# Patient Record
Sex: Male | Born: 1990
Health system: Southern US, Community
[De-identification: ages and names within clinical notes are randomized; demographics above are authoritative.]

## PROBLEM LIST (undated history)

## (undated) DIAGNOSIS — Z8669 Personal history of other diseases of the nervous system and sense organs: Secondary | ICD-10-CM

## (undated) DIAGNOSIS — G510 Bell's palsy: Secondary | ICD-10-CM

## (undated) HISTORY — DX: Personal history of other diseases of the nervous system and sense organs: Z86.69

---

## 2014-10-25 ENCOUNTER — Encounter (HOSPITAL_BASED_OUTPATIENT_CLINIC_OR_DEPARTMENT_OTHER): Payer: Self-pay | Admitting: *Deleted

## 2014-10-25 ENCOUNTER — Emergency Department (HOSPITAL_BASED_OUTPATIENT_CLINIC_OR_DEPARTMENT_OTHER)
Admission: EM | Admit: 2014-10-25 | Discharge: 2014-10-25 | Disposition: A | Discharge status: Home / Self Care | Payer: Self-pay | Attending: Emergency Medicine | Admitting: Emergency Medicine

## 2014-10-25 DIAGNOSIS — L259 Unspecified contact dermatitis, unspecified cause: Secondary | ICD-10-CM | POA: Insufficient documentation

## 2014-10-25 DIAGNOSIS — Z720 Tobacco use: Secondary | ICD-10-CM | POA: Insufficient documentation

## 2014-10-25 MED ORDER — HYDROCORTISONE 1 % EX CREA: TOPICAL_CREAM | CUTANEOUS | Status: DC

## 2014-10-25 MED ORDER — DIPHENHYDRAMINE HCL 25 MG PO TABS
25.0000 mg | ORAL_TABLET | Freq: Four times a day (QID) | ORAL | Status: DC
Start: 2014-10-25 — End: 2015-09-17

## 2014-10-25 NOTE — Discharge Instructions (Signed)

## 2014-10-25 NOTE — ED Provider Notes (Signed)
CSN: [id number]     Arrival date & time 10/25/14  1010 History   First MD Initiated Contact with Patient 10/25/14 1036     Chief Complaint  Patient presents with  . Rash     (Consider location/radiation/quality/duration/timing/severity/associated sxs/prior Treatment) HPI Patient works in Aeronautical [employer].  He reports a new rash with itching in his right antecubital fossa.  No other complaints.  No fevers or chills.  No history of asthma or eczema.  His pain is mild.  He has not tried any medications prior to arrival.   History reviewed. No pertinent past medical history. History reviewed. No pertinent past surgical history. No family history on file. Social History  Substance Use Topics  . Smoking status: Current Some Day Smoker  . Smokeless tobacco: None  . Alcohol Use: Yes     Comment: sociable    Review of Systems  All other systems reviewed and are negative.     Allergies  Review of patient's allergies indicates no known allergies.  Home Medications   Prior to Admission medications   Medication Sig Start Date End Date Taking? Authorizing Provider  diphenhydrAMINE (BENADRYL) 25 MG tablet Take 1 tablet (25 mg total) by mouth every 6 (six) hours. 10/25/14   Azalia Bilis, MD  hydrocortisone cream 1 % Apply to affected area 2 times daily 10/25/14   Azalia Bilis, MD   BP 116/69 mmHg  Pulse 72  Temp(Src) 98 F (36.7 C) (Oral)  Resp 18  Ht  (1.702 m)  Wt 138 lb (62.596 kg)  BMI 21.61 kg/m2  SpO2 98% Physical Exam  Constitutional: He is oriented to person, place, and time. He appears well-developed and well-nourished.  HENT:  Head: Normocephalic.  Eyes: EOM are normal.  Neck: Normal range of motion.  Pulmonary/Chest: Effort normal.  Abdominal: He exhibits no distension.  Musculoskeletal: Normal range of motion.  Neurological: He is alert and oriented to person, place, and time.  Skin:  Area of contact dermatitis without surrounding infection in his right  antecubital fossa.  Psychiatric: He has a normal mood and affect.  Nursing note and vitals reviewed.   ED Course  Procedures (including critical care time) Labs Review Labs Reviewed - No data to display  Imaging Review No results found. I have personally reviewed and evaluated these images and lab results as part of my medical decision-making.   EKG Interpretation None      MDM   Final diagnoses:  Contact dermatitis    Contact dermatitis without infection.  Small area.  Benadryl and hydrocortisone cream.    Azalia Bilis, MD 10/25/14 1056

## 2014-10-25 NOTE — ED Notes (Signed)
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

## 2014-11-09 ENCOUNTER — Emergency Department (HOSPITAL_BASED_OUTPATIENT_CLINIC_OR_DEPARTMENT_OTHER)
Admission: EM | Admit: 2014-11-09 | Discharge: 2014-11-09 | Disposition: A | Discharge status: Home / Self Care | Payer: Self-pay | Attending: Emergency Medicine | Admitting: Emergency Medicine

## 2014-11-09 ENCOUNTER — Encounter (HOSPITAL_BASED_OUTPATIENT_CLINIC_OR_DEPARTMENT_OTHER): Payer: Self-pay | Admitting: *Deleted

## 2014-11-09 DIAGNOSIS — G510 Bell's palsy: Secondary | ICD-10-CM

## 2014-11-09 DIAGNOSIS — Z720 Tobacco use: Secondary | ICD-10-CM | POA: Insufficient documentation

## 2014-11-09 MED ORDER — PREDNISONE 20 MG PO TABS: ORAL_TABLET | ORAL | Status: DC

## 2014-11-09 MED ORDER — VALACYCLOVIR HCL 1 G PO TABS: 1000.0000 mg | ORAL_TABLET | Freq: Three times a day (TID) | ORAL | Status: AC

## 2014-11-09 NOTE — Discharge Instructions (Signed)
Bell's Palsy °Bell's palsy is a condition in which the muscles on one side of the face cannot move (paralysis). This is because the nerves in the face are paralyzed. It is most often thought to be caused by a virus. The virus causes swelling of the nerve that controls movement on one side of the face. The nerve travels through a tight space surrounded by bone. When the nerve swells, it can be compressed by the bone. This results in damage to the protective covering around the nerve. This damage interferes with how the nerve communicates with the muscles of the face. As a result, it can cause weakness or paralysis of the facial muscles.  °Injury (trauma), tumor, and surgery may cause Bell's palsy, but most of the time the cause is unknown. It is a relatively common condition. It starts suddenly (abrupt onset) with the paralysis usually ending within 2 days. Bell's palsy is not dangerous. But because the eye does not close properly, you may need care to keep the eye from getting dry. This can include splinting (to keep the eye shut) or moistening with artificial tears. Bell's palsy very seldom occurs on both sides of the face at the same time. °SYMPTOMS  °· Eyebrow sagging. °· Drooping of the eyelid and corner of the mouth. °· Inability to close one eye. °· Loss of taste on the front of the tongue. °· Sensitivity to loud noises. °TREATMENT  °The treatment is usually non-surgical. If the patient is seen within the first 24 to 48 hours, a short course of steroids may be prescribed, in an attempt to shorten the length of the condition. Antiviral medicines may also be used with the steroids, but it is unclear if they are helpful.  °You will need to protect your eye, if you cannot close it. The cornea (clear covering over your eye) will become dry and can be damaged. Artificial tears can be used to keep your eye moist. Glasses or an eye patch should be worn to protect your eye. °PROGNOSIS  °Recovery is variable, ranging  from days to months. Although the problem usually goes away completely (about 80% of cases resolve), predicting the outcome is impossible. Most people improve within 3 weeks of when the symptoms began. Improvement may continue for 3 to 6 months. A small number of people have moderate to severe weakness that is permanent.  °HOME CARE INSTRUCTIONS  °· If your caregiver prescribed medication to reduce swelling in the nerve, use as directed. Do not stop taking the medication unless directed by your caregiver. °· Use moisturizing eye drops as needed to prevent drying of your eye, as directed by your caregiver. °· Protect your eye, as directed by your caregiver. °· Use facial massage and exercises, as directed by your caregiver. °· Perform your normal activities, and get your normal rest. °SEEK IMMEDIATE MEDICAL CARE IF:  °· There is pain, redness or irritation in the eye. °· You or your child has an oral temperature above 102° F (38.9° C), not controlled by medicine. °MAKE SURE YOU:  °· Understand these instructions. °· Will watch your condition. °· Will get help right away if you are not doing well or get worse. °Document Released: 01/30/2005 Document Revised: 04/24/2011 Document Reviewed: 05/09/2013 °[employer]® Patient Information ©2015 [employer]. This information is not intended to replace advice given to you by your health care provider. Make sure you discuss any questions you have with your health care provider. ° °

## 2014-11-09 NOTE — ED Notes (Signed)
Numbness to the right side of his face and unable to close his right eye x 2 days. No hx of Bells Palsy.

## 2014-11-09 NOTE — ED Provider Notes (Signed)
CSN: [id number]     Arrival date & time 11/09/14  1107 History   First MD Initiated Contact with Patient 11/09/14 1145     Chief Complaint  Patient presents with  . Numbness   Tommy Henderson is a 24 y.o. male who is otherwise healthy who presents to the ED complaining of a feeling of numbness in his right face, right lips, crooked smile and unable to completely close his right eye all starting two days ago. The patient reports he feels that his eye is more dried out and this morning he could not close his eye completely. The patient denies recent illness. He denies history of bell's palsy. He denies numbness, tingling or weakness in his extremities.  He denies fevers, sore throat, coughing, rashes, abdominal pain, nausea, vomiting, headache, neck pain, neck stiffness, or trouble swallowing.  (Consider location/radiation/quality/duration/timing/severity/associated sxs/prior Treatment) HPI  History reviewed. No pertinent past medical history. History reviewed. No pertinent past surgical history. No family history on file. Social History  Substance Use Topics  . Smoking status: Current Some Day Smoker  . Smokeless tobacco: None  . Alcohol Use: Yes     Comment: sociable    Review of Systems  Constitutional: Negative for fever, chills and fatigue.  HENT: Negative for congestion, drooling, ear pain, facial swelling, hearing loss, mouth sores, sore throat and trouble swallowing.   Eyes: Negative for pain and visual disturbance.  Respiratory: Negative for cough, shortness of breath and wheezing.   Cardiovascular: Negative for chest pain.  Gastrointestinal: Negative for nausea, vomiting and abdominal pain.  Genitourinary: Negative for dysuria.  Musculoskeletal: Negative for back pain, neck pain and neck stiffness.  Skin: Negative for rash.  Neurological: Positive for facial asymmetry and numbness. Negative for dizziness, speech difficulty, weakness, light-headedness and headaches.       Allergies  Review of patient's allergies indicates no known allergies.  Home Medications   Prior to Admission medications   Medication Sig Start Date End Date Taking? Authorizing Provider  diphenhydrAMINE (BENADRYL) 25 MG tablet Take 1 tablet (25 mg total) by mouth every 6 (six) hours. 10/25/14   Azalia Bilis, MD  hydrocortisone cream 1 % Apply to affected area 2 times daily 10/25/14   Azalia Bilis, MD  predniSONE (DELTASONE) 20 MG tablet Take 3 tablets daily (  total) for 5 days, then 2 tablets daily (40 mg total) for 2 days, then 1 tablet daily (20 mg total) for 2 days, then 1/2 tablet daily (10 mg total) for 2 days. 11/09/14   Everlene Farrier, PA-C  valACYclovir (VALTREX) 1000 MG tablet Take 1 tablet (1,000 mg total) by mouth 3 (three) times daily. 11/09/14 11/23/14  Everlene Farrier, PA-C   BP 122/70 mmHg  Pulse 73  Temp(Src) 98.2 F (36.8 C) (Oral)  Resp 18  Ht  (1.702 m)  Wt 138 lb (62.596 kg)  BMI 21.61 kg/m2  SpO2 100% Physical Exam  Constitutional: He is oriented to person, place, and time. He appears well-developed and well-nourished. No distress.   Nontoxic appearing.  HENT:  Head: Normocephalic and atraumatic.  Right Ear: External ear normal.  Left Ear: External ear normal.  Mouth/Throat: Oropharynx is clear and moist. No oropharyngeal exudate.  Eyes: Conjunctivae and EOM are normal. Pupils are equal, round, and reactive to light. Right eye exhibits no discharge. Left eye exhibits no discharge.  Neck: Normal range of motion. Neck supple. No JVD present. No tracheal deviation present.  Cardiovascular: Normal rate, regular rhythm, normal heart sounds and intact distal  pulses.   Pulmonary/Chest: Effort normal and breath sounds normal. No respiratory distress. He has no wheezes. He has no rales.  Abdominal: Soft. There is no tenderness. There is no guarding.  Musculoskeletal: He exhibits no edema or tenderness.  Lymphadenopathy:    He has no cervical adenopathy.   Neurological: He is alert and oriented to person, place, and time. Coordination normal.   The patient is alert and oriented 3. Evidence of cranial nerve VII palsy. Decreased right-sided nasolabial fold. Right-sided crooked smile. Patient  Is able to close both of his eyes but is unable to squeeze his right eye shut. He reports decreased sensation on the right side of his face. Creases in the right side of his forehead are diminished. EOMs are intact. Right eye does not appear dry.  Good and equal grip strengths bilaterally. No pronator drift. Finger nose intact bilaterally. Sensation intact in his bilateral upper and lower extremities. The patient is able to ambulate with normal gait.  Skin: Skin is warm and dry. No rash noted. He is not diaphoretic. No erythema. No pallor.  Psychiatric: He has a normal mood and affect. His behavior is normal.  Nursing note and vitals reviewed.   ED Course  Procedures (including critical care time) Labs Review Labs Reviewed - No data to display  Imaging Review No results found.    EKG Interpretation None      Filed Vitals:   11/09/14 1128  BP: 122/70  Pulse: 73  Temp: 98.2 F (36.8 C)  TempSrc: Oral  Resp: 18  Height:  (1.702 m)  Weight: 138 lb (62.596 kg)  SpO2: 100%     MDM   Meds given in ED:  Medications - No data to display  New Prescriptions   PREDNISONE (DELTASONE) 20 MG TABLET    Take 3 tablets daily (  total) for 5 days, then 2 tablets daily (40 mg total) for 2 days, then 1 tablet daily (20 mg total) for 2 days, then 1/2 tablet daily (10 mg total) for 2 days.   VALACYCLOVIR (VALTREX) 1000 MG TABLET    Take 1 tablet (1,000 mg total) by mouth 3 (three) times daily.    Final diagnoses:  Bell's palsy   This is a 24 y.o. male who is otherwise healthy who presents to the ED complaining of a feeling of numbness in his right face, right lips, crooked smile and unable to completely close his right eye all starting two days  ago. Exam is consistent with Bells' Palsy. No other neurological deficits. He is afebrile and non-toxic appearing.   I provided education on Bell's palsy and had treated. Will discharge with prescriptions for prednisone 10 day taper as well as Valtrex. I also advised the patient to obtain artificial tears for eye drops in his right eye to prevent his right eye from drying out. The patient is able to close his eye at this time.  I encouraged follow-up with primary care and strict return precautions. I advised the patient to follow-up with their primary care provider this week. I advised the patient to return to the emergency department with new or worsening symptoms or new concerns. The patient verbalized understanding and agreement with plan.     Everlene Farrier, PA-C 11/09/14 1227  Lyndal Pulley, MD 11/09/14 2052

## 2014-11-19 ENCOUNTER — Encounter: Payer: Self-pay | Admitting: Family Medicine

## 2014-11-19 ENCOUNTER — Ambulatory Visit (INDEPENDENT_AMBULATORY_CARE_PROVIDER_SITE_OTHER): Payer: Self-pay | Admitting: Family Medicine

## 2014-11-19 VITALS — BP 125/45 | HR 81 | Temp 98.8°F | Resp 18 | Ht 67.0 in | Wt 137.0 lb

## 2014-11-19 DIAGNOSIS — G510 Bell's palsy: Secondary | ICD-10-CM | POA: Insufficient documentation

## 2014-11-19 DIAGNOSIS — Z202 Contact with and (suspected) exposure to infections with a predominantly sexual mode of transmission: Secondary | ICD-10-CM

## 2014-11-19 DIAGNOSIS — Z0000 Encounter for general adult medical examination without abnormal findings: Secondary | ICD-10-CM

## 2014-11-19 LAB — CBC WITH DIFFERENTIAL/PLATELET
BASOS ABS: 0 10*3/uL (ref 0.0–0.1)
Basophils Relative: 0 % (ref 0–1)
EOS ABS: 0 10*3/uL (ref 0.0–0.7)
EOS PCT: 0 % (ref 0–5)
HCT: 43.1 % (ref 39.0–52.0)
Hemoglobin: 14.2 g/dL (ref 13.0–17.0)
Lymphocytes Relative: 8 % — ABNORMAL LOW (ref 12–46)
Lymphs Abs: 0.9 10*3/uL (ref 0.7–4.0)
MCH: 29.5 pg (ref 26.0–34.0)
MCHC: 32.9 g/dL (ref 30.0–36.0)
MCV: 89.6 fL (ref 78.0–100.0)
MONO ABS: 0.3 10*3/uL (ref 0.1–1.0)
MPV: 10.7 fL (ref 8.6–12.4)
Monocytes Relative: 3 % (ref 3–12)
Neutro Abs: 9.6 10*3/uL — ABNORMAL HIGH (ref 1.7–7.7)
Neutrophils Relative %: 89 % — ABNORMAL HIGH (ref 43–77)
PLATELETS: 271 10*3/uL (ref 150–400)
RBC: 4.81 MIL/uL (ref 4.22–5.81)
RDW: 12.2 % (ref 11.5–15.5)
WBC: 10.8 10*3/uL — AB (ref 4.0–10.5)

## 2014-11-19 LAB — COMPLETE METABOLIC PANEL WITH GFR
ALBUMIN: 4.2 g/dL (ref 3.6–5.1)
ALK PHOS: 103 U/L (ref 40–115)
ALT: 24 U/L (ref 9–46)
AST: 13 U/L (ref 10–40)
BILIRUBIN TOTAL: 0.5 mg/dL (ref 0.2–1.2)
BUN: 15 mg/dL (ref 7–25)
CALCIUM: 8.7 mg/dL (ref 8.6–10.3)
CO2: 31 mmol/L (ref 20–31)
CREATININE: 0.82 mg/dL (ref 0.60–1.35)
Chloride: 103 mmol/L (ref 98–110)
Glucose, Bld: 80 mg/dL (ref 65–99)
Potassium: 4.1 mmol/L (ref 3.5–5.3)
Sodium: 140 mmol/L (ref 135–146)
TOTAL PROTEIN: 6.8 g/dL (ref 6.1–8.1)

## 2014-11-19 LAB — POCT URINALYSIS DIP (DEVICE)
Bilirubin Urine: NEGATIVE
GLUCOSE, UA: NEGATIVE mg/dL
Hgb urine dipstick: NEGATIVE
KETONES UR: NEGATIVE mg/dL
LEUKOCYTES UA: NEGATIVE
Nitrite: NEGATIVE
PROTEIN: NEGATIVE mg/dL
Specific Gravity, Urine: 1.015 (ref 1.005–1.030)
Urobilinogen, UA: 0.2 mg/dL (ref 0.0–1.0)
pH: 7.5 (ref 5.0–8.0)

## 2014-11-19 NOTE — Progress Notes (Signed)
Subjective:    Patient ID: Tommy Henderson, male    DOB: 1990/07/06, 24 y.o.   MRN: [id number]  HPI  Mr. Syrus Nakama, a 24 year old male that presents accompanied by fiance to establish care. He was recently evaluated on the emergency department on 11/09/2014 for numbness to the right face and a crooked smile. Patient was found to have Bell's palsy. He was started on a regimen of glucocorticoids and acyclovir. Patient has completed the course. He states that his right eye is dry in the mornings. Patient states that he has been using visine to alleviate dryness to right eye. He denies headache, fatigue, facial numbness, nausea, vomiting, or diarrhea. Patient states that he has not had a primary physician. He has primarily been utilizing the emergency department or urgent care for most healthcare needs. He reports that he is generally healthy. He maintains that he exercises several times per week, he does not follow a health diet, and does not use barrier protection with sexual intercourse. He also states that he does not perform monthly testicular exams.  History reviewed. No pertinent past medical history.   There is no immunization history on file for this patient.  No Known Allergies Social History   Social History  . Marital Status: Single    Spouse Name: N/A  . Number of Children: N/A  . Years of Education: N/A   Occupational History  . Not on file.   Social History Main Topics  . Smoking status: Former Smoker    Quit date: 09/19/2014  . Smokeless tobacco: Not on file  . Alcohol Use: 0.0 oz/week    0 Standard drinks or equivalent per week     Comment: sociable  . Drug Use: Yes    Special: Marijuana  . Sexual Activity: Not on file   Other Topics Concern  . Not on file   Social History Narrative   Review of Systems  Constitutional: Negative.  Negative for fatigue.  HENT: Negative.   Eyes: Negative.   Respiratory: Negative.  Negative for cough and shortness of breath.    Cardiovascular: Negative.  Negative for chest pain, palpitations and leg swelling.  Gastrointestinal: Negative.  Negative for nausea, vomiting, diarrhea and constipation.  Endocrine: Negative.  Negative for cold intolerance, heat intolerance, polydipsia, polyphagia and polyuria.  Genitourinary: Negative.  Negative for discharge, penile pain and testicular pain.  Musculoskeletal: Negative.   Skin: Negative.   Allergic/Immunologic: Negative.  Negative for immunocompromised state.  Neurological: Positive for facial asymmetry.  Hematological: Negative.   Psychiatric/Behavioral: Negative.  Negative for suicidal ideas and sleep disturbance.       Objective:   Physical Exam  Constitutional: He appears well-developed and well-nourished.  HENT:  Head: Normocephalic and atraumatic.  Right Ear: External ear normal.  Left Ear: External ear normal.  Mouth/Throat: Oropharynx is clear and moist.  Eyes: Conjunctivae and EOM are normal. Pupils are equal, round, and reactive to light.  Neck: Normal range of motion. Neck supple.  Cardiovascular: Normal rate, regular rhythm, normal heart sounds and intact distal pulses.   Pulmonary/Chest: Effort normal and breath sounds normal.  Abdominal: Soft. Bowel sounds are normal.  Neurological: He is alert. He has normal strength. A cranial nerve deficit is present. No sensory deficit. He displays a negative Romberg sign.  Skin: Skin is warm and dry.  Psychiatric: He has a normal mood and affect. His behavior is normal. Judgment and thought content normal.      BP 125/45 mmHg  Pulse  81  Temp(Src) 98.8 F (37.1 C) (Oral)  Resp 18  Ht  (1.702 m)  Wt 137 lb (62.143 kg)  BMI 21.45 kg/m2  SpO2 100% Assessment & Plan:  1. Bell's palsy Complete acyclovir and prednisone therapy. Utilized OTC eye lubricant as directed for occasional eye dryness. Discussed fact that most recover from Bell's Palsy. Will follow up in 1 month to evaluate for corneal abrasion.   2. Routine health maintenance Recommend a low fat, low salt diet Continue exercising 3-4 days per week Recommend monthly self-testicular disease Recommend using barrier protection with sexual intercourse Patient refuses immunizations - POCT urinalysis dipstick - COMPLETE METABOLIC PANEL WITH GFR - TSH - CBC with Differential  3. Possible exposure to STD Patient does not use barrier protection with sexual intercourse.  - RPR - HIV antibody (with reflex) - GC/Chlamydia Probe Amp   RTC: 1 month for Bell's Palsy The patient was given clear instructions to go to ER or return to medical center if symptoms do not improve, worsen or new problems develop. The patient verbalized understanding. Will notify patient with laboratory results.  Massie Maroon, FNP

## 2014-11-19 NOTE — Patient Instructions (Signed)
Bell Palsy °Bell palsy is a condition in which the muscles on one side of the face become paralyzed. This often causes one side of the face to droop. It is a common condition and most people recover completely. °RISK FACTORS °Risk factors for Bell palsy include: °· Pregnancy. °· Diabetes. °· An infection by a virus, such as infections that cause cold sores. °CAUSES  °Bell palsy is caused by damage to or inflammation of a nerve in your face. It is unclear why this happens, but an infection by a virus may lead to it. Most of the time the reason it happens is unknown. °SIGNS AND SYMPTOMS  °Symptoms can range from mild to severe and can take place over a number of hours. Symptoms may include: °· Being unable to: °¨ Raise one or both eyebrows. °¨ Close one or both eyes. °¨ Feel parts of your face (facial numbness). °· Drooping of the eyelid and corner of the mouth. °· Weakness in the face. °· Paralysis of half your face. °· Loss of taste. °· Sensitivity to loud noises. °· Difficulty chewing. °· Tearing up of the affected eye. °· Dryness in the affected eye. °· Drooling. °· Pain behind one ear. °DIAGNOSIS  °Diagnosis of Bell palsy may include: °· A medical history and physical exam. °· An MRI. °· A CT scan. °· Electromyography (EMG). This is a test that checks how your nerves are working. °TREATMENT  °Treatment may include antiviral medicine to help shorten the length of the condition. Sometimes treatment is not needed and the symptoms go away on their own. °HOME CARE INSTRUCTIONS  °· Take medicines only as directed by your health care provider. °· Do facial massages and exercises as directed by your health care provider. °· If your eye is affected: °¨ Use moisturizing eye drops to prevent drying of your eye as directed by your health care provider. °¨ Protect your eye as directed by your health care provider. °SEEK MEDICAL CARE IF: °· Your symptoms do not get better or get worse. °· You are drooling. °· Your eye is red,  irritated, or hurts. °SEEK IMMEDIATE MEDICAL CARE IF:  °· Another part of your body feels weak or numb. °· You have difficulty swallowing. °· You have a fever along with symptoms of Bell palsy. °· You develop neck pain. °MAKE SURE YOU:  °· Understand these instructions. °· Will watch your condition. °· Will get help right away if you are not doing well or get worse. °  °This information is not intended to replace advice given to you by your health care provider. Make sure you discuss any questions you have with your health care provider. °  °Document Released: 01/30/2005 Document Revised: 10/21/2014 Document Reviewed: 05/09/2013 °[employer]vier Interactive Patient Education ©2016 [employer] ° °

## 2014-11-20 LAB — GC/CHLAMYDIA PROBE AMP
CT PROBE, AMP APTIMA: NEGATIVE
GC Probe RNA: NEGATIVE

## 2014-11-20 LAB — TSH: TSH: 0.248 u[IU]/mL — ABNORMAL LOW (ref 0.350–4.500)

## 2014-11-20 LAB — HIV ANTIBODY (ROUTINE TESTING W REFLEX): HIV: NONREACTIVE

## 2014-11-20 LAB — RPR

## 2014-12-23 ENCOUNTER — Ambulatory Visit: Payer: Self-pay | Admitting: Family Medicine

## 2015-07-15 ENCOUNTER — Encounter (HOSPITAL_BASED_OUTPATIENT_CLINIC_OR_DEPARTMENT_OTHER): Payer: Self-pay | Admitting: Emergency Medicine

## 2015-07-15 ENCOUNTER — Emergency Department (HOSPITAL_BASED_OUTPATIENT_CLINIC_OR_DEPARTMENT_OTHER)
Admission: EM | Admit: 2015-07-15 | Discharge: 2015-07-15 | Disposition: A | Discharge status: Home / Self Care | Payer: Self-pay | Attending: Emergency Medicine | Admitting: Emergency Medicine

## 2015-07-15 DIAGNOSIS — K4090 Unilateral inguinal hernia, without obstruction or gangrene, not specified as recurrent: Secondary | ICD-10-CM | POA: Insufficient documentation

## 2015-07-15 DIAGNOSIS — Z87891 Personal history of nicotine dependence: Secondary | ICD-10-CM | POA: Insufficient documentation

## 2015-07-15 HISTORY — DX: Bell's palsy: G51.0

## 2015-07-15 LAB — URINALYSIS, ROUTINE W REFLEX MICROSCOPIC
Bilirubin Urine: NEGATIVE
Glucose, UA: NEGATIVE mg/dL
Ketones, ur: NEGATIVE mg/dL
Leukocytes, UA: NEGATIVE
Nitrite: NEGATIVE
Protein, ur: NEGATIVE mg/dL
Specific Gravity, Urine: 1.021 (ref 1.005–1.030)
pH: 5.5 (ref 5.0–8.0)

## 2015-07-15 LAB — URINE MICROSCOPIC-ADD ON

## 2015-07-15 NOTE — Discharge Instructions (Signed)

## 2015-07-15 NOTE — ED Notes (Signed)
LLQ abd pain x3 days, intermittent. Increases with movement. Pt denies N/V/D. Pt denies urinary symptoms.

## 2015-07-15 NOTE — ED Notes (Signed)
MD at bedside. 

## 2015-07-27 NOTE — ED Provider Notes (Signed)
CSN: [id number]     Arrival date & time 07/15/15  0820 History   First MD Initiated Contact with Patient 07/15/15 [phone]     Chief Complaint  Patient presents with  . Abdominal Pain     (Consider location/radiation/quality/duration/timing/severity/associated sxs/prior Treatment) HPI   25 year old male with intermittent left lower quadrant/left groin pain. Onset 3 days ago. Pain is worse with movement and bending over. Minimal pain at rest. No urinary complaints. No rash. No nausea or vomiting. No change in his bowel movements. History of similar type symptoms previous to 3 days ago. Has not tried taking anything specifically for the symptoms.  Past Medical History  Diagnosis Date  . Bell's palsy    History reviewed. No pertinent past surgical history. Family History  Problem Relation Age of Onset  . Family history unknown: Yes   Social History  Substance Use Topics  . Smoking status: Former Smoker    Quit date: 09/19/2014  . Smokeless tobacco: None  . Alcohol Use: 0.0 oz/week    0 Standard drinks or equivalent per week     Comment: sociable    Review of Systems  All systems reviewed and negative, other than as noted in HPI.   Allergies  Review of patient's allergies indicates no known allergies.  Home Medications   Prior to Admission medications   Medication Sig Start Date End Date Taking? Authorizing Provider  diphenhydrAMINE (BENADRYL) 25 MG tablet Take 1 tablet (25 mg total) by mouth every 6 (six) hours. Patient not taking: Reported on 11/19/2014 10/25/14   Azalia BilisKevin Campos, MD  hydrocortisone cream 1 % Apply to affected area 2 times daily Patient not taking: Reported on 11/19/2014 10/25/14   Azalia BilisKevin Campos, MD  predniSONE (DELTASONE) 20 MG tablet Take 3 tablets daily (60mg  total) for 5 days, then 2 tablets daily (40 mg total) for 2 days, then 1 tablet daily (20 mg total) for 2 days, then 1/2 tablet daily (10 mg total) for 2 days. 11/09/14   Everlene FarrierWilliam Dansie, PA-C   BP 121/80  mmHg  Pulse 67  Temp(Src) 99 F (37.2 C) (Oral)  Resp 16  Ht 5\' 7"  (1.702 m)  Wt 138 lb (62.596 kg)  BMI 21.61 kg/m2  SpO2 100% Physical Exam  Constitutional: He appears well-developed and well-nourished. No distress.  HENT:  Head: Normocephalic and atraumatic.  Eyes: Conjunctivae are normal. Right eye exhibits no discharge. Left eye exhibits no discharge.  Neck: Neck supple.  Cardiovascular: Normal rate, regular rhythm and normal heart sounds.  Exam reveals no gallop and no friction rub.   No murmur heard. Pulmonary/Chest: Effort normal and breath sounds normal. No respiratory distress.  Abdominal: Soft. He exhibits no distension. There is no tenderness.  Genitourinary:  Left inguinal hernia. Small bulge felt with Valsalva. No inguinal adenopathy. External male genitalia. No concerning lesions noted.  Musculoskeletal: He exhibits no edema or tenderness.  Neurological: He is alert.  Skin: Skin is warm and dry.  Psychiatric: He has a normal mood and affect. His behavior is normal. Thought content normal.  Nursing note and vitals reviewed.   ED Course  Procedures (including critical care time) Labs Review Labs Reviewed  URINALYSIS, ROUTINE W REFLEX MICROSCOPIC (NOT AT [facility]) - Abnormal; Notable for the following:    Hgb urine dipstick LARGE (*)    All other components within normal limits  URINE MICROSCOPIC-ADD ON - Abnormal; Notable for the following:    Squamous Epithelial / LPF 0-5 (*)    Bacteria, UA RARE (*)  All other components within normal limits    Imaging Review No results found. I have personally reviewed and evaluated these images and lab results as part of my medical decision-making.   EKG Interpretation None      MDM   Final diagnoses:  Left inguinal hernia    25 year old male with intermittent left inguinal pain. He does have a hernia on exam. No signs of incarceration. Return precautions were discussed. Symptomatic treatment. Surgical follow-up  for persistent symptoms.    Raeford Razor, MD 07/27/15 [phone]

## 2015-09-17 ENCOUNTER — Encounter (HOSPITAL_BASED_OUTPATIENT_CLINIC_OR_DEPARTMENT_OTHER): Payer: Self-pay | Admitting: *Deleted

## 2015-09-17 ENCOUNTER — Emergency Department (HOSPITAL_BASED_OUTPATIENT_CLINIC_OR_DEPARTMENT_OTHER)
Admission: EM | Admit: 2015-09-17 | Discharge: 2015-09-17 | Disposition: A | Discharge status: Home / Self Care | Payer: Self-pay | Attending: Emergency Medicine | Admitting: Emergency Medicine

## 2015-09-17 DIAGNOSIS — R6883 Chills (without fever): Secondary | ICD-10-CM | POA: Insufficient documentation

## 2015-09-17 DIAGNOSIS — F17210 Nicotine dependence, cigarettes, uncomplicated: Secondary | ICD-10-CM | POA: Insufficient documentation

## 2015-09-17 DIAGNOSIS — R112 Nausea with vomiting, unspecified: Secondary | ICD-10-CM | POA: Insufficient documentation

## 2015-09-17 LAB — BASIC METABOLIC PANEL
ANION GAP: 6 (ref 5–15)
BUN: 12 mg/dL (ref 6–20)
CHLORIDE: 103 mmol/L (ref 101–111)
CO2: 26 mmol/L (ref 22–32)
Calcium: 8.5 mg/dL — ABNORMAL LOW (ref 8.9–10.3)
Creatinine, Ser: 0.81 mg/dL (ref 0.61–1.24)
Glucose, Bld: 96 mg/dL (ref 65–99)
POTASSIUM: 3.4 mmol/L — AB (ref 3.5–5.1)
SODIUM: 135 mmol/L (ref 135–145)

## 2015-09-17 LAB — CBC WITH DIFFERENTIAL/PLATELET
BASOS ABS: 0 10*3/uL (ref 0.0–0.1)
BASOS PCT: 1 %
EOS ABS: 0 10*3/uL (ref 0.0–0.7)
EOS PCT: 0 %
HCT: 39.2 % (ref 39.0–52.0)
HEMOGLOBIN: 13.5 g/dL (ref 13.0–17.0)
LYMPHS ABS: 0.9 10*3/uL (ref 0.7–4.0)
Lymphocytes Relative: 21 %
MCH: 29.5 pg (ref 26.0–34.0)
MCHC: 34.4 g/dL (ref 30.0–36.0)
MCV: 85.8 fL (ref 78.0–100.0)
Monocytes Absolute: 0.8 10*3/uL (ref 0.1–1.0)
Monocytes Relative: 17 %
NEUTROS PCT: 61 %
Neutro Abs: 2.7 10*3/uL (ref 1.7–7.7)
PLATELETS: 168 10*3/uL (ref 150–400)
RBC: 4.57 MIL/uL (ref 4.22–5.81)
RDW: 11 % — ABNORMAL LOW (ref 11.5–15.5)
WBC: 4.5 10*3/uL (ref 4.0–10.5)

## 2015-09-17 NOTE — ED Provider Notes (Signed)
[facility] Note   CSN: [id number] Arrival date & time: 09/17/15  2006  First Provider Contact:   First MD Initiated Contact with Patient 09/17/15 2119     By signing my name below, I, Freida Busman, attest that this documentation has been prepared under the direction and in the presence of Lavera Guise, MD . Electronically Signed: Freida Busman, Scribe. 09/17/2015. 10:48 PM.  History   Chief Complaint Chief Complaint  Patient presents with  . Emesis    The history is provided by the patient. No language interpreter was used.    HPI Comments:  Tommy Henderson is a 25 y.o. male who presents to the Emergency Department complaining of 1 episode of vomiting this morning.  Pt notes he didn't feel well after eating old spaghetti last night w/ beer. HAd large bowel movement this morning but no diarrhea. He denies blood in his stool and blood in his vomit. No fever or abdominal pain. Pt reports associated chills and decreased PO intake today. Pt also notes short episode of mild lightheadedness today which has resolved. He denies fever, urinary symptoms, vision loss, speech changes, and abdominal pain. He also denies h/o abdominal surgeries. No recent sick contacts. No alleviating factors noted.  Past Medical History:  Diagnosis Date  . Bell's palsy     Patient Active Problem List   Diagnosis Date Noted  . Bell's palsy 11/19/2014    History reviewed. No pertinent surgical history.    Home Medications    Prior to Admission medications   Not on File    Family History Family History  Problem Relation Age of Onset  . Family history unknown: Yes    Social History Social History  Substance Use Topics  . Smoking status: Current Every Day Smoker    Types: Cigars    Last attempt to quit: 09/19/2014  . Smokeless tobacco: Not on file  . Alcohol use 0.0 oz/week     Comment: sociable     Allergies   Review of patient's allergies indicates no known  allergies.   Review of Systems Review of Systems  Constitutional: Positive for appetite change and chills.  Eyes: Negative for visual disturbance.  Gastrointestinal: Positive for vomiting. Negative for blood in stool, constipation and diarrhea.  Genitourinary: Negative for dysuria, frequency and urgency.  Neurological: Positive for light-headedness (resolved). Negative for speech difficulty.  All other systems reviewed and are negative.  Physical Exam Updated Vital Signs BP 118/85   Pulse 88   Temp 99.6 F (37.6 C)   Resp 18   Ht  (1.676 m)   Wt 130 lb (59 kg)   SpO2 100%   BMI 20.98 kg/m   Physical Exam Physical Exam  Nursing note and vitals reviewed. Constitutional: Well developed, well nourished, non-toxic, and in no acute distress Head: Normocephalic and atraumatic.  Mouth/Throat: Oropharynx is clear and moist.  Neck: Normal range of motion. Neck supple.  Cardiovascular: Normal rate and regular rhythm.   Pulmonary/Chest: Effort normal and breath sounds normal.  Abdominal: Soft. There is no tenderness. There is no rebound and no guarding.  Musculoskeletal: Normal range of motion.  Neurological: Alert, no facial droop, fluent speech, moves all extremities symmetrically Skin: Skin is warm and dry.  Psychiatric: Cooperative   ED Treatments / Results  DIAGNOSTIC STUDIES:  Oxygen Saturation is 100% on RA, normal by my interpretation.    COORDINATION OF CARE:  9:28 PM Discussed treatment plan with pt at bedside and pt agreed  to plan.  Labs (all labs ordered are listed, but only abnormal results are displayed) Labs Reviewed  CBC WITH DIFFERENTIAL/PLATELET - Abnormal; Notable for the following:       Result Value   RDW 11.0 (*)    All other components within normal limits  BASIC METABOLIC PANEL - Abnormal; Notable for the following:    Potassium 3.4 (*)    Calcium 8.5 (*)    All other components within normal limits    EKG  EKG Interpretation None        Radiology No results found.  Procedures Procedures   Medications Ordered in ED Medications - No data to display   Initial Impression / Assessment and Plan / ED Course  I have reviewed the triage vital signs and the nursing notes.  Pertinent labs & imaging results that were available during my care of the patient were reviewed by me and considered in my medical decision making (see chart for details).  Clinical Course   25 year old male, otherwise healthy, who presents with one episode of nausea and vomiting with decreased appetite and chills throughout the day today. This is in the setting of eating old spaghetti yesterday and I suspect that this is related. He is well-appearing, well-hydrated, and with normal vital signs. Has a soft and benign abdomen. Remainder of exam is unremarkable. Basic blood work overall unremarkable aside from mild hypokalemia of 3.4. No longer feels nauseous and is able to eat and drink in the ED without any difficulty. Feels improved.  suspect that this may be benign GI illness, that is now resolving. He will continue supportive care at home. Strict return and follow-up instructions are reviewed. He expressed understanding of all discharge instructions, and felt comfortable to plan of care. Final Clinical Impressions(s) / ED Diagnoses   Final diagnoses:  Non-intractable vomiting with nausea, vomiting of unspecified type    New Prescriptions New Prescriptions   No medications on file   I personally performed the services described in this documentation, which was scribed in my presence. The recorded information has been reviewed and is accurate.     Lavera Guise, MD 09/17/15 2250

## 2015-09-17 NOTE — ED Triage Notes (Signed)
Pt c/o n/v/d x 1 episode this am

## 2015-09-17 NOTE — Discharge Instructions (Signed)
Return without fail for worsening symptoms, including fever, severe abdominal pain, intractable headaches, intractable vomiting, or any other symptoms concerning to you.  We suspect that you had upset from the food you ate last night. Your blood work overall looks good.

## 2015-09-17 NOTE — ED Notes (Signed)
Pt states no n/v w crackers and ginger ale

## 2015-09-17 NOTE — ED Notes (Signed)
Pt states vomited x 1 this am,  Had has some facial soreness at times when look to far side of visual field

## 2015-12-01 ENCOUNTER — Encounter (HOSPITAL_BASED_OUTPATIENT_CLINIC_OR_DEPARTMENT_OTHER): Payer: Self-pay | Admitting: Emergency Medicine

## 2015-12-01 ENCOUNTER — Emergency Department (HOSPITAL_BASED_OUTPATIENT_CLINIC_OR_DEPARTMENT_OTHER)
Admission: EM | Admit: 2015-12-01 | Discharge: 2015-12-01 | Disposition: A | Discharge status: Home / Self Care | Payer: Self-pay | Attending: Emergency Medicine | Admitting: Emergency Medicine

## 2015-12-01 DIAGNOSIS — W57XXXA Bitten or stung by nonvenomous insect and other nonvenomous arthropods, initial encounter: Secondary | ICD-10-CM | POA: Insufficient documentation

## 2015-12-01 DIAGNOSIS — S00262A Insect bite (nonvenomous) of left eyelid and periocular area, initial encounter: Secondary | ICD-10-CM | POA: Insufficient documentation

## 2015-12-01 DIAGNOSIS — Y929 Unspecified place or not applicable: Secondary | ICD-10-CM | POA: Insufficient documentation

## 2015-12-01 DIAGNOSIS — F17290 Nicotine dependence, other tobacco product, uncomplicated: Secondary | ICD-10-CM | POA: Insufficient documentation

## 2015-12-01 DIAGNOSIS — T63444A Toxic effect of venom of bees, undetermined, initial encounter: Secondary | ICD-10-CM

## 2015-12-01 DIAGNOSIS — Y939 Activity, unspecified: Secondary | ICD-10-CM | POA: Insufficient documentation

## 2015-12-01 DIAGNOSIS — Y999 Unspecified external cause status: Secondary | ICD-10-CM | POA: Insufficient documentation

## 2015-12-01 NOTE — ED Provider Notes (Signed)
[facility]   CSN: [id number] Arrival date & time: 12/01/15  0734     History   Chief Complaint Chief Complaint  Patient presents with  . Eye Problem    HPI Tommy Henderson is a 25 y.o. male.  HPI Patient was stung by a bee on the left eyelid yesterday.  The eye initially swelled and as the day wore on he went down.  When he woke up this morning it was swelled again.  It is swelled where he has difficulty seeing unless he manually pulls his eye open.  He denies pain or fever. Past Medical History:  Diagnosis Date  . Bell's palsy     Patient Active Problem List   Diagnosis Date Noted  . Bell's palsy 11/19/2014    History reviewed. No pertinent surgical history.     Home Medications    Prior to Admission medications   Not on File    Family History Family History  Problem Relation Age of Onset  . Family history unknown: Yes    Social History Social History  Substance Use Topics  . Smoking status: Current Every Day Smoker    Types: Cigars    Last attempt to quit: 09/19/2014  . Smokeless tobacco: Never Used  . Alcohol use 0.0 oz/week     Comment: sociable     Allergies   Review of patient's allergies indicates no known allergies.   Review of Systems Review of Systems  All other systems reviewed and are negative.    Physical Exam Updated Vital Signs BP 135/74 (BP Location: Right Arm)   Pulse 65   Temp 98.3 F (36.8 C) (Oral)   Resp 16   Ht 5\' 7"  (1.702 m)   Wt 139 lb (63 kg)   SpO2 100%   BMI 21.77 kg/m   Physical Exam  Constitutional: He is oriented to person, place, and time. He appears well-developed and well-nourished. No distress.  HENT:  Head: Normocephalic.  Eyes: Pupils are equal, round, and reactive to light. Right eye exhibits normal extraocular motion. Left eye exhibits normal extraocular motion. Right pupil is round and reactive. Left pupil is round and reactive.  Left eyelid has some local edema and  swelling.  No evidence of cellulitis.  Neck: Normal range of motion.  Cardiovascular: Normal rate and intact distal pulses.   Pulmonary/Chest: No respiratory distress.  Abdominal: Normal appearance. He exhibits no distension.  Musculoskeletal: Normal range of motion.  Neurological: He is alert and oriented to person, place, and time. No cranial nerve deficit.  Skin: Skin is warm and dry. No rash noted.  Psychiatric: He has a normal mood and affect. His behavior is normal.  Nursing note and vitals reviewed.    ED Treatments / Results  Labs (all labs ordered are listed, but only abnormal results are displayed) Labs Reviewed - No data to display  EKG  EKG Interpretation None       Radiology No results found.  Procedures Procedures (including critical care time)  Medications Ordered in ED Medications - No data to display   Initial Impression / Assessment and Plan / ED Course  I have reviewed the triage vital signs and the nursing notes.  Pertinent labs & imaging results that were available during my care of the patient were reviewed by me and considered in my medical decision making (see chart for details).  Clinical Course      Final Clinical Impressions(s) / ED Diagnoses   Final diagnoses:  Bee sting, undetermined intent, initial encounter    New Prescriptions New Prescriptions   No medications on file     Nelva Nayobert Leonda Cristo, MD 12/01/15 [phone]

## 2015-12-01 NOTE — ED Notes (Signed)
MD at bedside. 

## 2015-12-01 NOTE — ED Triage Notes (Signed)
Pt with L eye swelling. Pt states he was stung by a bee around the eye yesterday.

## 2015-12-01 NOTE — Discharge Instructions (Signed)
Take 25 mg of Benadryl every 8 hours today.

## 2019-03-21 ENCOUNTER — Other Ambulatory Visit: Payer: Self-pay

## 2019-03-21 ENCOUNTER — Emergency Department (HOSPITAL_BASED_OUTPATIENT_CLINIC_OR_DEPARTMENT_OTHER)
Admission: EM | Admit: 2019-03-21 | Discharge: 2019-03-21 | Disposition: A | Discharge status: Home / Self Care | Payer: Self-pay | Attending: Emergency Medicine | Admitting: Emergency Medicine

## 2019-03-21 ENCOUNTER — Encounter (HOSPITAL_BASED_OUTPATIENT_CLINIC_OR_DEPARTMENT_OTHER): Payer: Self-pay | Admitting: *Deleted

## 2019-03-21 DIAGNOSIS — L0231 Cutaneous abscess of buttock: Secondary | ICD-10-CM | POA: Insufficient documentation

## 2019-03-21 DIAGNOSIS — F1210 Cannabis abuse, uncomplicated: Secondary | ICD-10-CM | POA: Insufficient documentation

## 2019-03-21 DIAGNOSIS — F17210 Nicotine dependence, cigarettes, uncomplicated: Secondary | ICD-10-CM | POA: Insufficient documentation

## 2019-03-21 DIAGNOSIS — G510 Bell's palsy: Secondary | ICD-10-CM | POA: Insufficient documentation

## 2019-03-21 MED ORDER — SULFAMETHOXAZOLE-TRIMETHOPRIM 800-160 MG PO TABS: 1.0000 | ORAL_TABLET | Freq: Two times a day (BID) | ORAL | 0 refills | Status: AC

## 2019-03-21 MED ORDER — LIDOCAINE-EPINEPHRINE (PF) 2 %-1:200000 IJ SOLN
10.0000 mL | Freq: Once | INTRAMUSCULAR | Status: AC
  Administered 2019-03-21: 10 mL
  Filled 2019-03-21: qty 10

## 2019-03-21 MED FILL — SULFAMETHOXAZOLE-TMP DS TAB: 800-160 | 7 days supply | Qty: 14 | Fill #0

## 2019-03-21 NOTE — Discharge Instructions (Signed)
Please read and follow all provided instructions.  Your diagnoses today include:  1. Abscess of buttock, left     Tests performed today include:  Vital signs. See below for your results today.   Medications prescribed:   Bactrim (trimethoprim/sulfamethoxazole) - antibiotic  You have been prescribed an antibiotic medicine: take the entire course of medicine even if you are feeling better. Stopping early can cause the antibiotic not to work.  Take any prescribed medications only as directed.   Home care instructions:   Follow any educational materials contained in this packet  Follow-up instructions: Return to the Emergency Department in 48 hours for a recheck if your symptoms are not significantly improved.  Please follow-up with your primary care provider in the next 1 week for further evaluation of your symptoms.   Return instructions:  Return to the Emergency Department if you have:  Fever  Worsening symptoms  Worsening pain  Worsening swelling  Redness of the skin that moves away from the affected area, especially if it streaks away from the affected area   Any other emergent concerns  Your vital signs today were: BP (!) 143/80   Pulse 64   Temp 98.8 F (37.1 C) (Oral)   Resp 20   Ht 5\' 7"  (1.702 m)   Wt 62.1 kg   SpO2 98%   BMI 21.46 kg/m  If your blood pressure (BP) was elevated above 135/85 this visit, please have this repeated by your doctor within one month. --------------

## 2019-03-21 NOTE — ED Notes (Signed)
I and D tray to bedside

## 2019-03-21 NOTE — ED Triage Notes (Signed)
Abscess to his left buttock x 2 weeks. No drainage.

## 2019-03-21 NOTE — ED Notes (Signed)
Small  Firm marble size area to left inner butt cheek x 2 weeks , sore to touch

## 2019-03-21 NOTE — ED Provider Notes (Signed)
[facility] EMERGENCY DEPARTMENT Provider Note   CSN: [id number] Arrival date & time: 03/21/19  1414     History Chief Complaint  Patient presents with  . Abscess    Tommy Henderson is a 29 y.o. male.  Patient presents to the emergency department with complaint of boil to the left buttock area which has been worse over the past 2 or 3 days.  He states that he had 1 in the past that resolved without any treatments.  He has never had an incision and drainage before.  Patient has been applying warm compresses without any improvement.  No fevers, nausea or vomiting.  No rectal pain or difficulty with bowel movements.        Past Medical History:  Diagnosis Date  . Bell's palsy     Patient Active Problem List   Diagnosis Date Noted  . Bell's palsy 11/19/2014    History reviewed. No pertinent surgical history.     Family History  Family history unknown: Yes    Social History   Tobacco Use  . Smoking status: Current Every Day Smoker    Types: Cigars    Last attempt to quit: 09/19/2014    Years since quitting: 4.5  . Smokeless tobacco: Never Used  Substance Use Topics  . Alcohol use: Yes    Alcohol/week: 0.0 standard drinks    Comment: sociable  . Drug use: Yes    Types: Marijuana    Home Medications Prior to Admission medications   Not on File    Allergies    Patient has no known allergies.  Review of Systems   Review of Systems  Constitutional: Negative for fever.  Gastrointestinal: Negative for nausea and vomiting.  Skin: Negative for color change.       Positive for abscess  Hematological: Negative for adenopathy.    Physical Exam Updated Vital Signs BP (!) 143/80   Pulse 64   Resp 20   Ht 5\' 7"  (1.702 m)   Wt 62.1 kg   SpO2 98%   BMI 21.46 kg/m   Physical Exam Vitals and nursing note reviewed.  Constitutional:      Appearance: He is well-developed.  HENT:     Head: Normocephalic and atraumatic.  Eyes:     Conjunctiva/sclera:  Conjunctivae normal.  Pulmonary:     Effort: No respiratory distress.  Musculoskeletal:     Cervical back: Normal range of motion and neck supple.  Skin:    General: Skin is warm and dry.     Comments: Patient with approximately 3 cm x 2 cm area of induration and moderate fluctuance to the left buttock along the superior crease of the buttocks.  No active drainage.  Area is minimally tender.  Neurological:     Mental Status: He is alert.     ED Results / Procedures / Treatments   Labs (all labs ordered are listed, but only abnormal results are displayed) Labs Reviewed - No data to display  EKG None  Radiology No results found.  Procedures . Incision and Drainage  Date/Time: 03/21/2019 3:06 PM Performed by: 05/19/2019, PA-C Authorized by: Renne Crigler, PA-C   Consent:    Consent obtained:  Verbal   Consent given by:  Patient   Risks discussed:  Pain, bleeding, incomplete drainage, damage to other organs and infection   Alternatives discussed:  No treatment Location:    Type:  Abscess   Size:  3cm   Location:  Anogenital   Anogenital location:  Gluteal cleft Pre-procedure details:    Skin preparation:  Betadine Anesthesia (see MAR for exact dosages):    Anesthesia method:  Local infiltration   Local anesthetic:  Lidocaine 2% w/o epi Procedure type:    Complexity:  Simple Procedure details:    Needle aspiration: no     Incision types:  Stab incision   Scalpel blade:  11   Wound management:  Probed and deloculated   Drainage:  Purulent and bloody   Drainage amount:  Moderate   Wound treatment:  Wound left open   Packing materials:  None Post-procedure details:    Patient tolerance of procedure:  Tolerated well, no immediate complications   (including critical care time)  Medications Ordered in ED Medications  lidocaine-EPINEPHrine (XYLOCAINE W/EPI) 2 %-1:200000 (PF) injection 10 mL (has no administration in time range)    ED Course  I have reviewed  the triage vital signs and the nursing notes.  Pertinent labs & imaging results that were available during my care of the patient were reviewed by me and considered in my medical decision making (see chart for details).  Patient seen and examined.  Discussed incision and drainage procedure with the patient and he agrees to proceed.  Medication ordered.  Vital signs reviewed and are as follows: BP (!) 143/80   Pulse 64   Resp 20   Ht 5\' 7"  (1.702 m)   Wt 62.1 kg   SpO2 98%   BMI 21.46 kg/m   3:07 PM I&D performed.   The patient was urged to return to the Emergency Department urgently with worsening pain, swelling, expanding erythema especially if it streaks away from the affected area, fever, or if they have any other concerns.   The patient was urged to return to the Emergency Department or go to their PCP in 48 hours for wound recheck if the area is not significantly improved.  The patient verbalized understanding and stated agreement with this plan.     MDM Rules/Calculators/A&P                      Patient with gluteal cleft abscess, I&D successful.  Given location, prescription for Bactrim provided.  Hopefully patient will do well with warm soaks at home.   Final Clinical Impression(s) / ED Diagnoses Final diagnoses:  Abscess of buttock, left    Rx / DC Orders ED Discharge Orders    None       Carlisle Cater, PA-C 03/21/19 [place], DO 03/21/19 1510

## 2019-08-16 ENCOUNTER — Encounter (HOSPITAL_BASED_OUTPATIENT_CLINIC_OR_DEPARTMENT_OTHER): Payer: Self-pay | Admitting: Emergency Medicine

## 2019-08-16 ENCOUNTER — Other Ambulatory Visit: Payer: Self-pay

## 2019-08-16 ENCOUNTER — Emergency Department (HOSPITAL_BASED_OUTPATIENT_CLINIC_OR_DEPARTMENT_OTHER)
Admission: EM | Admit: 2019-08-16 | Discharge: 2019-08-16 | Disposition: A | Discharge status: Home / Self Care | Payer: Self-pay | Attending: Emergency Medicine | Admitting: Emergency Medicine

## 2019-08-16 DIAGNOSIS — F17200 Nicotine dependence, unspecified, uncomplicated: Secondary | ICD-10-CM | POA: Insufficient documentation

## 2019-08-16 DIAGNOSIS — G510 Bell's palsy: Secondary | ICD-10-CM | POA: Insufficient documentation

## 2019-08-16 MED ORDER — PREDNISONE 10 MG PO TABS: 60.0000 mg | ORAL_TABLET | Freq: Every day | ORAL | 0 refills | Status: AC

## 2019-08-16 MED ORDER — VALACYCLOVIR HCL 1 G PO TABS: 1000.0000 mg | ORAL_TABLET | Freq: Three times a day (TID) | ORAL | 0 refills | Status: DC

## 2019-08-16 NOTE — ED Triage Notes (Signed)
Pt states he is having left sided facial numbness  Pt states sxs started yesterday  Hx of Bells Palsy

## 2019-08-16 NOTE — ED Provider Notes (Signed)
[facility] EMERGENCY DEPARTMENT Provider Note  CSN: [id number] Arrival date & time: 08/16/19 0450  Chief Complaint(s) facial numbness  HPI Tommy Henderson is a 29 y.o. male   CC: face numbness  Onset/Duration: Gradual, yesterday Timing: Constant/worsening Location: Left face started in the perioral region Severity: Mild Modifying Factors:  Improved by: Nothing  Worsened by: Nothing Associated Signs/Symptoms:  Pertinent (+): Mild facial weakness on the left  Pertinent (-): Headache, neck pain, visual disturbance, fevers, chills, recent infection, trauma, extremity weakness Context: Similar to presentation several years ago    HPI  Past Medical History Past Medical History:  Diagnosis Date  . Bell's palsy    Patient Active Problem List   Diagnosis Date Noted  . Bell's palsy 11/19/2014   Home Medication(s) Prior to Admission medications   Medication Sig Start Date End Date Taking? Authorizing Provider  predniSONE (DELTASONE) 10 MG tablet Take 6 tablets (60 mg total) by mouth daily for 7 days. 08/16/19 08/23/19  Nira Conn, MD  valACYclovir (VALTREX) 1000 MG tablet Take 1 tablet (1,000 mg total) by mouth 3 (three) times daily. 08/16/19   Nira Conn, MD                                                                                                                                    Past Surgical History History reviewed. No pertinent surgical history. Family History Family History  Family history unknown: Yes    Social History Social History   Tobacco Use  . Smoking status: Current Some Day Smoker    Last attempt to quit: 09/19/2014    Years since quitting: 4.9  . Smokeless tobacco: Never Used  Vaping Use  . Vaping Use: Never used  Substance Use Topics  . Alcohol use: Yes    Alcohol/week: 0.0 standard drinks    Comment: social  . Drug use: Yes    Types: Marijuana    Comment: xanax on occ    Allergies Patient has no known  allergies.  Review of Systems Review of Systems All other systems are reviewed and are negative for acute change except as noted in the HPI  Physical Exam Vital Signs  I have reviewed the triage vital signs BP (!) 139/95 (BP Location: Left Arm)   Pulse 66   Temp 98.5 F (36.9 C) (Oral)   Resp 18   Ht 5\' 7"  (1.702 m)   Wt 67.1 kg   SpO2 100%   BMI 23.18 kg/m   Physical Exam Vitals reviewed.  Constitutional:      General: He is not in acute distress.    Appearance: He is well-developed. He is not diaphoretic.  HENT:     Head: Normocephalic and atraumatic.     Jaw: No trismus.     Right Ear: External ear normal.     Left Ear: External ear normal.     Nose: Nose normal.  Eyes:  General: No scleral icterus.    Conjunctiva/sclera: Conjunctivae normal.  Neck:     Trachea: Phonation normal.  Cardiovascular:     Rate and Rhythm: Normal rate and regular rhythm.  Pulmonary:     Effort: Pulmonary effort is normal. No respiratory distress.     Breath sounds: No stridor.  Abdominal:     General: There is no distension.  Musculoskeletal:        General: Normal range of motion.     Cervical back: Normal range of motion.  Neurological:     Mental Status: He is alert and oriented to person, place, and time.     Comments: Mental Status:  Alert and oriented to person, place, and time.  Attention and concentration normal.  Speech clear.  Recent memory is intact  Cranial Nerves:  II Visual Fields: Intact to confrontation. Visual fields intact. III, IV, VI: Pupils equal and reactive to light and near. Full eye movement without nystagmus  V Facial Sensation: Decreased to left perioral region. No weakness of masticatory muscles  VII: mild left facial droop with decreased left forehead furrowing, ocular and buccal strength VIII Auditory Acuity: Grossly normal  IX/X: The uvula is midline; the palate elevates symmetrically  XI: Normal sternocleidomastoid and trapezius strength    XII: The tongue is midline. No atrophy or fasciculations.   Motor System: Muscle Strength: 5/5 and symmetric in the upper and lower extremities. No pronation or drift.  Muscle Tone: Tone and muscle bulk are normal in the upper and lower extremities.   Reflexes: DTRs: 1+ and symmetrical in all four extremities.  Coordination:No tremor.  Sensation: Intact to light touch Gait: Routine gait normal.   Psychiatric:        Behavior: Behavior normal.     ED Results and Treatments Labs (all labs ordered are listed, but only abnormal results are displayed) Labs Reviewed - No data to display                                                                                                                       EKG  EKG Interpretation  Date/Time:    Ventricular Rate:    PR Interval:    QRS Duration:   QT Interval:    QTC Calculation:   R Axis:     Text Interpretation:        Radiology No results found.  Pertinent labs & imaging results that were available during my care of the patient were reviewed by me and considered in my medical decision making (see chart for details).  Medications Ordered in ED Medications - No data to display  Procedures Procedures  (including critical care time)  Medical Decision Making / ED Course I have reviewed the nursing notes for this encounter and the patient's prior records (if available in EHR or on provided paperwork).   Tommy Henderson was evaluated in Emergency Department on 08/16/2019 for the symptoms described in the history of present illness. He was evaluated in the context of the global COVID-19 pandemic, which necessitated consideration that the patient might be at risk for infection with the SARS-CoV-2 virus that causes COVID-19. Institutional protocols and algorithms that pertain to the evaluation of patients at risk  for COVID-19 are in a state of rapid change based on information released by regulatory bodies including the CDC and federal and state organizations. These policies and algorithms were followed during the patient's care in the ED.  Consistent with Bell's palsy.< 72 hrs since onset. Prednisone and valtrex Rx.      Final Clinical Impression(s) / ED Diagnoses Final diagnoses:  Left-sided Bell's palsy    The patient appears reasonably screened and/or stabilized for discharge and I doubt any other medical condition or other [facility] requiring further screening, evaluation, or treatment in the ED at this time prior to discharge. Safe for discharge with strict return precautions.  Disposition: Discharge  Condition: Good  I have discussed the results, Dx and Tx plan with the patient/family who expressed understanding and agree(s) with the plan. Discharge instructions discussed at length. The patient/family was given strict return precautions who verbalized understanding of the instructions. No further questions at time of discharge.    ED Discharge Orders         Ordered    predniSONE (DELTASONE) 10 MG tablet  Daily     Discontinue  Reprint     08/16/19 0512    valACYclovir (VALTREX) 1000 MG tablet  3 times daily     Discontinue  Reprint     08/16/19 4098            Follow Up: Primary care provider  Schedule an appointment as soon as possible for a visit  If you do not have a primary care physician, contact [facility] at [phone] for referral     This chart was dictated using voice recognition software.  Despite best efforts to proofread,  errors can occur which can change the documentation meaning.   Nira Conn, MD 08/16/19 [phone]

## 2019-11-29 ENCOUNTER — Other Ambulatory Visit: Payer: Self-pay

## 2019-11-29 ENCOUNTER — Encounter (HOSPITAL_BASED_OUTPATIENT_CLINIC_OR_DEPARTMENT_OTHER): Payer: Self-pay | Admitting: Emergency Medicine

## 2019-11-29 ENCOUNTER — Emergency Department (HOSPITAL_BASED_OUTPATIENT_CLINIC_OR_DEPARTMENT_OTHER)
Admission: EM | Admit: 2019-11-29 | Discharge: 2019-11-29 | Disposition: A | Discharge status: Home / Self Care | Payer: Self-pay | Attending: Emergency Medicine | Admitting: Emergency Medicine

## 2019-11-29 DIAGNOSIS — K0889 Other specified disorders of teeth and supporting structures: Secondary | ICD-10-CM | POA: Insufficient documentation

## 2019-11-29 DIAGNOSIS — F17200 Nicotine dependence, unspecified, uncomplicated: Secondary | ICD-10-CM | POA: Insufficient documentation

## 2019-11-29 MED ORDER — HYDROCODONE-ACETAMINOPHEN 5-325 MG PO TABS
1.0000 | ORAL_TABLET | Freq: Once | ORAL | Status: AC
Start: 2019-11-29 — End: 2019-11-29
  Administered 2019-11-29: 1 via ORAL
  Filled 2019-11-29: qty 1

## 2019-11-29 MED ORDER — PENICILLIN V POTASSIUM 250 MG PO TABS
500.0000 mg | ORAL_TABLET | Freq: Once | ORAL | Status: AC
  Administered 2019-11-29: 500 mg via ORAL
  Filled 2019-11-29: qty 2

## 2019-11-29 MED ORDER — PENICILLIN V POTASSIUM 500 MG PO TABS: 500.0000 mg | ORAL_TABLET | Freq: Four times a day (QID) | ORAL | 0 refills | Status: AC

## 2019-11-29 MED ORDER — CHLORHEXIDINE GLUCONATE 0.12 % MT SOLN: 15.0000 mL | Freq: Two times a day (BID) | OROMUCOSAL | 0 refills | Status: DC

## 2019-11-29 NOTE — ED Triage Notes (Signed)
C/o dental pain on left lower side for the last two days. Using otc meds with no relief.

## 2019-11-29 NOTE — ED Notes (Signed)
Pt endorses having ride r/t pain medication

## 2019-11-29 NOTE — ED Provider Notes (Signed)
[facility] EMERGENCY DEPARTMENT Provider Note   CSN: [id number] Arrival date & time: 11/29/19  1558     History Chief Complaint  Patient presents with  . Dental Pain    Tommy Henderson is a 29 y.o. male left lower dental pain began several months ago initially began after he chipped his tooth eating.  Pain has been intermittent but worse over the last 2 days.  Sharp constant pain moderate intensity nonradiating worsened with chewing on the left side, nonradiating improved temporarily with ibuprofen and Tylenol.  Denies fever/chills, facial swelling or trismus, difficulty swallowing, voice change, neck pain, nausea/vomiting, abdominal pain or any additional questions.  HPI     Past Medical History:  Diagnosis Date  . Bell's palsy     Patient Active Problem List   Diagnosis Date Noted  . Bell's palsy 11/19/2014    History reviewed. No pertinent surgical history.     Family History  Family history unknown: Yes    Social History   Tobacco Use  . Smoking status: Current Some Day Smoker    Last attempt to quit: 09/19/2014    Years since quitting: 5.1  . Smokeless tobacco: Never Used  Vaping Use  . Vaping Use: Never used  Substance Use Topics  . Alcohol use: Yes    Alcohol/week: 0.0 standard drinks    Comment: social  . Drug use: Yes    Types: Marijuana    Comment: xanax on occ     Home Medications Prior to Admission medications   Medication Sig Start Date End Date Taking? Authorizing Provider  chlorhexidine (PERIDEX) 0.12 % solution Use as directed 15 mLs in the mouth or throat 2 (two) times daily. Rinse and spit, do not swallow. 11/29/19   Harlene Salts A, PA-C  penicillin v potassium (VEETID) 500 MG tablet Take 1 tablet (500 mg total) by mouth 4 (four) times daily for 7 days. 11/29/19 12/06/19  Harlene Salts A, PA-C  valACYclovir (VALTREX) 1000 MG tablet Take 1 tablet (1,000 mg total) by mouth 3 (three) times daily. 08/16/19   Nira Conn, MD    Allergies    Patient has no known allergies.  Review of Systems   Review of Systems  Constitutional: Negative for chills and fever.  HENT: Positive for dental problem. Negative for facial swelling, sore throat, trouble swallowing and voice change.   Gastrointestinal: Negative.  Negative for abdominal pain, nausea and vomiting.  Musculoskeletal: Negative.  Negative for back pain and neck pain.  Neurological: Negative.  Negative for weakness, numbness and headaches.    Physical Exam Updated Vital Signs BP 129/87 (BP Location: Left Arm)   Pulse 66   Temp 98.8 F (37.1 C) (Oral)   Resp 18   Ht 5\' 7"  (1.702 m)   Wt 63.5 kg   SpO2 100%   BMI 21.93 kg/m   Physical Exam Constitutional:      General: He is not in acute distress.    Appearance: Normal appearance. He is well-developed. He is not ill-appearing or diaphoretic.  HENT:     Head: Normocephalic and atraumatic.     Jaw: There is normal jaw occlusion. No trismus.     Right Ear: Tympanic membrane and external ear normal.     Left Ear: Tympanic membrane and external ear normal.     Nose: Nose normal.     Right Sinus: No maxillary sinus tenderness.     Left Sinus: No maxillary sinus tenderness.     Mouth/Throat:  Mouth: Mucous membranes are moist.     Pharynx: Oropharynx is clear. Uvula midline.      Comments: Left back molar chipped, tender to percussion.  The patient has normal phonation and is in control of secretions. No stridor.  Midline uvula without edema. Soft palate rises symmetrically. No tonsillar erythema, swelling or exudates. Tongue protrusion is normal, floor of mouth is soft. No trismus. No creptius on neck palpation. No gingival erythema or fluctuance noted. Mucus membranes moist. Eyes:     General: Vision grossly intact. Gaze aligned appropriately.     Extraocular Movements: Extraocular movements intact.     Conjunctiva/sclera: Conjunctivae normal.     Pupils: Pupils are equal, round,  and reactive to light.  Neck:     Trachea: Trachea and phonation normal. No tracheal tenderness or tracheal deviation.     Meningeal: Brudzinski's sign absent.  Pulmonary:     Effort: Pulmonary effort is normal. No respiratory distress.  Abdominal:     General: There is no distension.     Palpations: Abdomen is soft.     Tenderness: There is no abdominal tenderness. There is no guarding or rebound.  Musculoskeletal:        General: Normal range of motion.     Cervical back: Normal range of motion and neck supple.  Skin:    General: Skin is warm and dry.  Neurological:     Mental Status: He is alert.     GCS: GCS eye subscore is 4. GCS verbal subscore is 5. GCS motor subscore is 6.     Comments: Speech is clear and goal oriented, follows commands Major Cranial nerves without deficit, no facial droop Moves extremities without ataxia, coordination intact  Psychiatric:        Behavior: Behavior normal.     ED Results / Procedures / Treatments   Labs (all labs ordered are listed, but only abnormal results are displayed) Labs Reviewed - No data to display  EKG None  Radiology No results found.  Procedures Procedures (including critical care time)  Medications Ordered in ED Medications  HYDROcodone-acetaminophen (NORCO/VICODIN) 5-325 MG per tablet 1 tablet (1 tablet Oral Given 11/29/19 1731)  penicillin v potassium (VEETID) tablet 500 mg (500 mg Oral Given 11/29/19 1731)    ED Course  I have reviewed the triage vital signs and the nursing notes.  Pertinent labs & imaging results that were available during my care of the patient were reviewed by me and considered in my medical decision making (see chart for details).    MDM Rules/Calculators/A&P                         Additional history obtained from: 1. Nursing notes from this visit. ------------------ 29 year old male presents for left lower dental pain intermittent for several months and recheck.  Got worse over  the past 2 days.  He has a chipped tooth there with a small cavity present.  No signs or symptoms of dental abscess, no swelling/erythema/tenderness of the gums.  Patient is well-appearing, afebrile, nontoxic, speaking well.  Patient able to swallow without pain.  No signs of swelling or concern for Ludwig's angina/Peritonsilar abscess/Retropharyngeal abscess or other deep tissue infections.  No sign of swelling of the neck, patient has good range of motion of the neck, no trismus.  Will treat with penicillin VK 500 mg 4 times daily x7 days and referred to a dentist for definitive care.  At this anti-inflammatories discussed.  Additionally patient was given 1 Norco in the ED for pain, he reports that he is child's mother will be driving him home today.  At this time there does not appear to be any evidence of an acute emergency medical condition and the patient appears stable for discharge with appropriate outpatient follow up. Diagnosis was discussed with patient who verbalizes understanding of care plan and is agreeable to discharge. I have discussed return precautions with patient who verbalizes understanding. Patient encouraged to follow-up with their PCP and dentist. All questions answered.   Note: Portions of this report may have been transcribed using voice recognition software. Every effort was made to ensure accuracy; however, inadvertent computerized transcription errors may still be present. Final Clinical Impression(s) / ED Diagnoses Final diagnoses:  Pain, dental    Rx / DC Orders ED Discharge Orders         Ordered    chlorhexidine (PERIDEX) 0.12 % solution  2 times daily        11/29/19 1817    penicillin v potassium (VEETID) 500 MG tablet  4 times daily        11/29/19 1817           Bill Salinas, PA-C 11/29/19 1819    Melene Plan, DO 11/29/19 [phone]

## 2019-11-29 NOTE — Discharge Instructions (Addendum)
At this time there does not appear to be the presence of an emergent medical condition, however there is always the potential for conditions to change. Please read and follow the below instructions.  Please return to the Emergency Department immediately for any new or worsening symptoms . Please be sure to follow up with your Primary Care Provider within one week regarding your visit today; please call their office to schedule an appointment even if you are feeling better for a follow-up visit. Call the dentist Dr. Mia Creek on your discharge paperwork to schedule follow-up appointment for definitive dental care. You were given a pain pill in the emergency department today which may make you drowsy.  Do not drive, drink alcohol or perform any potentially dangerous activities for the rest of the day. Please take your antibiotic Penicillin VK as prescribed until complete to help with your symptoms.  Please drink enough water to avoid dehydration and get plenty of rest. You may use the chlorhexidine mouth rinse as prescribed to help with your symptoms.  Do not swallow chlorhexidine, spit it out.  Go to the nearest Emergency Department immediately if: You have fever or chills You cannot open your mouth. You are having trouble breathing or swallowing. Your face, neck, or jaw is swollen. You have any new/concerning or worsening of symptoms.  Please read the additional information packets attached to your discharge summary.  Do not take your medicine if  develop an itchy rash, swelling in your mouth or lips, or difficulty breathing; call 911 and seek immediate emergency medical attention if this occurs.  You may review your lab tests and imaging results in their entirety on your MyChart account.  Please discuss all results of fully with your primary care provider and other specialist at your follow-up visit.  Note: Portions of this text may have been transcribed using voice recognition software. Every  effort was made to ensure accuracy; however, inadvertent computerized transcription errors may still be present.

## 2019-11-29 NOTE — ED Notes (Signed)
Pt discharged to home. Discharge instructions have been discussed with patient and/or family members. Pt verbally acknowledges understanding d/c instructions, and endorses comprehension to checkout at registration before leaving.  °

## 2020-01-23 ENCOUNTER — Emergency Department (HOSPITAL_COMMUNITY)
Admission: EM | Admit: 2020-01-23 | Discharge: 2020-01-23 | Disposition: A | Discharge status: Home / Self Care | Payer: Self-pay | Attending: Emergency Medicine | Admitting: Emergency Medicine

## 2020-01-23 ENCOUNTER — Other Ambulatory Visit: Payer: Self-pay

## 2020-01-23 ENCOUNTER — Emergency Department (HOSPITAL_COMMUNITY): Payer: Self-pay

## 2020-01-23 ENCOUNTER — Encounter (HOSPITAL_COMMUNITY): Payer: Self-pay | Admitting: Emergency Medicine

## 2020-01-23 DIAGNOSIS — S0181XA Laceration without foreign body of other part of head, initial encounter: Secondary | ICD-10-CM | POA: Insufficient documentation

## 2020-01-23 DIAGNOSIS — W19XXXA Unspecified fall, initial encounter: Secondary | ICD-10-CM | POA: Insufficient documentation

## 2020-01-23 DIAGNOSIS — F17200 Nicotine dependence, unspecified, uncomplicated: Secondary | ICD-10-CM | POA: Insufficient documentation

## 2020-01-23 DIAGNOSIS — R569 Unspecified convulsions: Secondary | ICD-10-CM | POA: Insufficient documentation

## 2020-01-23 DIAGNOSIS — Z23 Encounter for immunization: Secondary | ICD-10-CM | POA: Insufficient documentation

## 2020-01-23 LAB — BASIC METABOLIC PANEL
Anion gap: 15 (ref 5–15)
BUN: 13 mg/dL (ref 6–20)
CO2: 22 mmol/L (ref 22–32)
Calcium: 8.7 mg/dL — ABNORMAL LOW (ref 8.9–10.3)
Chloride: 107 mmol/L (ref 98–111)
Creatinine, Ser: 1.28 mg/dL — ABNORMAL HIGH (ref 0.61–1.24)
GFR, Estimated: >60 mL/min (ref >60)
Glucose, Bld: 83 mg/dL (ref 70–99)
Potassium: 4 mmol/L (ref 3.5–5.1)
Sodium: 144 mmol/L (ref 135–145)

## 2020-01-23 LAB — CBC WITH DIFFERENTIAL/PLATELET
Abs Immature Granulocytes: 0.06 10*3/uL (ref 0.00–0.07)
Basophils Absolute: 0.1 10*3/uL (ref 0.0–0.1)
Basophils Relative: 0 %
Eosinophils Absolute: 0.1 10*3/uL (ref 0.0–0.5)
Eosinophils Relative: 1 %
HCT: 47.2 % (ref 39.0–52.0)
Hemoglobin: 15.1 g/dL (ref 13.0–17.0)
Immature Granulocytes: 1 %
Lymphocytes Relative: 13 %
Lymphs Abs: 1.6 10*3/uL (ref 0.7–4.0)
MCH: 29.3 pg (ref 26.0–34.0)
MCHC: 32 g/dL (ref 30.0–36.0)
MCV: 91.5 fL (ref 80.0–100.0)
Monocytes Absolute: 0.8 10*3/uL (ref 0.1–1.0)
Monocytes Relative: 7 %
Neutro Abs: 9.6 10*3/uL — ABNORMAL HIGH (ref 1.7–7.7)
Neutrophils Relative %: 78 %
Platelets: 268 10*3/uL (ref 150–400)
RBC: 5.16 MIL/uL (ref 4.22–5.81)
RDW: 11.2 % — ABNORMAL LOW (ref 11.5–15.5)
WBC: 12.2 10*3/uL — ABNORMAL HIGH (ref 4.0–10.5)
nRBC: 0 % (ref 0.0–0.2)

## 2020-01-23 LAB — HEPATIC FUNCTION PANEL
ALT: 61 U/L — ABNORMAL HIGH (ref 0–44)
AST: 86 U/L — ABNORMAL HIGH (ref 15–41)
Albumin: 4.7 g/dL (ref 3.5–5.0)
Alkaline Phosphatase: 69 U/L (ref 38–126)
Bilirubin, Direct: 0.2 mg/dL (ref 0.0–0.2)
Indirect Bilirubin: 0.2 mg/dL — ABNORMAL LOW (ref 0.3–0.9)
Total Bilirubin: 0.4 mg/dL (ref 0.3–1.2)
Total Protein: 7.6 g/dL (ref 6.5–8.1)

## 2020-01-23 MED ORDER — TETANUS-DIPHTH-ACELL PERTUSSIS 5-2.5-18.5 LF-MCG/0.5 IM SUSY
0.5000 mL | PREFILLED_SYRINGE | Freq: Once | INTRAMUSCULAR | Status: AC
  Administered 2020-01-23: 0.5 mL via INTRAMUSCULAR
  Filled 2020-01-23: qty 0.5

## 2020-01-23 MED ORDER — BACITRACIN ZINC 500 UNIT/GM EX OINT: 1.0000 "application " | TOPICAL_OINTMENT | Freq: Two times a day (BID) | CUTANEOUS | Status: DC

## 2020-01-23 MED ORDER — LORAZEPAM 2 MG/ML IJ SOLN
1.0000 mg | Freq: Once | INTRAMUSCULAR | Status: AC
  Administered 2020-01-23: 1 mg via INTRAVENOUS
  Filled 2020-01-23: qty 1

## 2020-01-23 MED ORDER — LIDOCAINE-EPINEPHRINE 1 %-1:100000 IJ SOLN
10.0000 mL | Freq: Once | INTRAMUSCULAR | Status: AC
  Administered 2020-01-23: 10 mL
  Filled 2020-01-23: qty 1

## 2020-01-23 NOTE — ED Provider Notes (Signed)
Is a [facility] EMERGENCY DEPARTMENT Provider Note   CSN: [id number] Arrival date & time: 01/23/20  1739     History Chief Complaint  Patient presents with  . Seizures    Pt brought to ED by [facility] from home for a c/o new onset seizure, ETOH on board and weed. Laceration to left lower lip, left side pace and chin. HR 70, BP 114/76, R 19 SPO 99% CBG 98% RA, Covid + 3 months ago.    Tommy Henderson is a 29 y.o. male.  HPI   This patient is a 29 year old male, he has no prior history of seizures, he does report that he was told that he had a seizure today by a family member.  There is no further history at this time, the paramedics found the patient to be with normal mental status with a normal sugar of 98 a laceration to his chin and some evidence of tongue biting.  They placed a dressing prehospital, there is no further seizures and the patient returned to normal mental status prior to arrival.  The patient does not recall any abnormal events today.  He does state that he had a "airplane bottle of alcohol" and that he has been taking his sister's Xanax regularly but recently stopped.  The patient denies any prior history of seizures, he denies any history of headache, he does not have a neck pain, he has no numbness or weakness no chest pain coughing or shortness of breath, no nausea vomiting or diarrhea.  He has not had any other systemic symptoms and was well per prior to this occurring.  Again additional history obtained from the paramedics is that no further seizure activity was seen after the initial one at home.  Past Medical History:  Diagnosis Date  . Bell's palsy     Patient Active Problem List   Diagnosis Date Noted  . Bell's palsy 11/19/2014    History reviewed. No pertinent surgical history.     Family History  Family history unknown: Yes    Social History   Tobacco Use  . Smoking status: Current Some Day Smoker    Last attempt to quit: 09/19/2014     Years since quitting: 5.3  . Smokeless tobacco: Never Used  Vaping Use  . Vaping Use: Never used  Substance Use Topics  . Alcohol use: Yes    Alcohol/week: 0.0 standard drinks    Comment: social  . Drug use: Yes    Types: Marijuana    Comment: xanax on occ     Home Medications Prior to Admission medications   Not on File    Allergies    Patient has no known allergies.  Review of Systems   Review of Systems  Constitutional: Negative for chills and fever.  HENT: Negative for sore throat.        Tongue pain  Eyes: Negative for visual disturbance.  Respiratory: Negative for cough and shortness of breath.   Cardiovascular: Negative for chest pain.  Gastrointestinal: Negative for abdominal pain, diarrhea, nausea and vomiting.  Genitourinary: Negative for dysuria and frequency.  Musculoskeletal: Negative for back pain and neck pain.  Skin: Positive for wound.  Neurological: Positive for seizures and headaches. Negative for weakness and numbness.  Hematological: Negative for adenopathy.  Psychiatric/Behavioral: Negative for behavioral problems.    Physical Exam Updated Vital Signs BP 132/81   Pulse 74   Temp 98 F (36.7 C)   Resp (!) 23   Ht  1.702 m (5\' 7" )   Wt 63.5 kg   SpO2 99%   BMI 21.93 kg/m   Physical Exam Vitals and nursing note reviewed.  Constitutional:      General: He is not in acute distress.    Appearance: He is well-developed and well-nourished.  HENT:     Head: Normocephalic.     Comments: Small laceration to the left lower lip at the corner, evidence of tongue biting on the right side, laceration to the chin    Mouth/Throat:     Mouth: Oropharynx is clear and moist.     Pharynx: No oropharyngeal exudate.  Eyes:     General: No scleral icterus.       Right eye: No discharge.        Left eye: No discharge.     Extraocular Movements: EOM normal.     Conjunctiva/sclera: Conjunctivae normal.     Pupils: Pupils are equal, round, and reactive  to light.  Neck:     Thyroid: No thyromegaly.     Vascular: No JVD.  Cardiovascular:     Rate and Rhythm: Normal rate and regular rhythm.     Pulses: Intact distal pulses.     Heart sounds: Normal heart sounds. No murmur heard. No friction rub. No gallop.   Pulmonary:     Effort: Pulmonary effort is normal. No respiratory distress.     Breath sounds: Normal breath sounds. No wheezing or rales.  Abdominal:     General: Bowel sounds are normal. There is no distension.     Palpations: Abdomen is soft. There is no mass.     Tenderness: There is no abdominal tenderness.  Musculoskeletal:        General: No tenderness or edema. Normal range of motion.     Cervical back: Normal range of motion and neck supple.  Lymphadenopathy:     Cervical: No cervical adenopathy.  Skin:    General: Skin is warm and dry.     Findings: No erythema or rash.     Comments: Laceration as noted above to the chin  Neurological:     Mental Status: He is alert.     Coordination: Coordination normal.     Comments: Normal speech, normal coordination, normal movement of all 4 extremities with normal strength, cranial nerves III through XII are normal  Psychiatric:        Mood and Affect: Mood and affect normal.        Behavior: Behavior normal.     ED Results / Procedures / Treatments   Labs (all labs ordered are listed, but only abnormal results are displayed) Labs Reviewed  BASIC METABOLIC PANEL - Abnormal; Notable for the following components:      Result Value   Creatinine, Ser 1.28 (*)    Calcium 8.7 (*)    All other components within normal limits  HEPATIC FUNCTION PANEL - Abnormal; Notable for the following components:   AST 86 (*)    ALT 61 (*)    Indirect Bilirubin 0.2 (*)    All other components within normal limits  CBC WITH DIFFERENTIAL/PLATELET - Abnormal; Notable for the following components:   WBC 12.2 (*)    RDW 11.2 (*)    Neutro Abs 9.6 (*)    All other components within normal  limits    EKG EKG Interpretation  Date/Time:  Friday January 23 2020 17:48:39 EST Ventricular Rate:  70 PR Interval:    QRS Duration: 80 QT Interval:  401 QTC Calculation: 433 R Axis:   66 Text Interpretation: Sinus arrhythmia ST elev, probable normal early repol pattern No old tracing to compare Confirmed by Eber Hong ([id number]) on 01/23/2020 5:52:49 PM   Radiology CT Head Wo Contrast  Result Date: 01/23/2020 CLINICAL DATA:  Seizure, acute, history of trauma. Polytrauma, critical, head/cervical spine injury suspected. Additional history provided: Seizure after fall. EXAM: CT HEAD WITHOUT CONTRAST CT CERVICAL SPINE WITHOUT CONTRAST TECHNIQUE: Multidetector CT imaging of the head and cervical spine was performed following the standard protocol without intravenous contrast. Multiplanar CT image reconstructions of the cervical spine were also generated. COMPARISON:  Report from radiographs of the cervical spine 04/26/2008 (images unavailable). FINDINGS: CT HEAD FINDINGS Brain: Cerebral volume is normal. There is no acute intracranial hemorrhage. No demarcated cortical infarct. No extra-axial fluid collection. No evidence of intracranial mass. No midline shift. Vascular: No hyperdense vessel. Skull: Normal. Negative for fracture or focal lesion. Sinuses/Orbits: Visualized orbits show no acute finding. No significant paranasal sinus disease at the imaged levels. CT CERVICAL SPINE FINDINGS Alignment: Straightening of the expected cervical lordosis. Trace C5-C6 grade 1 retrolisthesis. Skull base and vertebrae: The basion-dental and atlanto-dental intervals are maintained.No evidence of acute fracture to the cervical spine. Mild chronic appearing deformity of the anterosuperior C5 vertebral body, which may be degenerative or posttraumatic in etiology (series 9, image 33). Soft tissues and spinal canal: No prevertebral fluid or swelling. No visible canal hematoma. Disc levels: No more than mild disc  space narrowing at any level. Multilevel disc bulges with no more than mild appreciable spinal canal stenosis. Upper chest: No consolidation within the imaged lung apices. No visible pneumothorax. IMPRESSION: CT head: No evidence of acute intracranial abnormality. CT cervical spine: 1. No evidence of acute fracture to the cervical spine. 2. Mild chronic appearing deformity of the anterosuperior C5 vertebral body, which may be degenerative or posttraumatic in etiology. 3. Trace C5-C6 grade 1 retrolisthesis. 4. Mild cervical spondylosis as described. Electronically Signed   By: Jackey Loge DO   On: 01/23/2020 18:44   CT Cervical Spine Wo Contrast  Result Date: 01/23/2020 CLINICAL DATA:  Seizure, acute, history of trauma. Polytrauma, critical, head/cervical spine injury suspected. Additional history provided: Seizure after fall. EXAM: CT HEAD WITHOUT CONTRAST CT CERVICAL SPINE WITHOUT CONTRAST TECHNIQUE: Multidetector CT imaging of the head and cervical spine was performed following the standard protocol without intravenous contrast. Multiplanar CT image reconstructions of the cervical spine were also generated. COMPARISON:  Report from radiographs of the cervical spine 04/26/2008 (images unavailable). FINDINGS: CT HEAD FINDINGS Brain: Cerebral volume is normal. There is no acute intracranial hemorrhage. No demarcated cortical infarct. No extra-axial fluid collection. No evidence of intracranial mass. No midline shift. Vascular: No hyperdense vessel. Skull: Normal. Negative for fracture or focal lesion. Sinuses/Orbits: Visualized orbits show no acute finding. No significant paranasal sinus disease at the imaged levels. CT CERVICAL SPINE FINDINGS Alignment: Straightening of the expected cervical lordosis. Trace C5-C6 grade 1 retrolisthesis. Skull base and vertebrae: The basion-dental and atlanto-dental intervals are maintained.No evidence of acute fracture to the cervical spine. Mild chronic appearing deformity of  the anterosuperior C5 vertebral body, which may be degenerative or posttraumatic in etiology (series 9, image 33). Soft tissues and spinal canal: No prevertebral fluid or swelling. No visible canal hematoma. Disc levels: No more than mild disc space narrowing at any level. Multilevel disc bulges with no more than mild appreciable spinal canal stenosis. Upper chest: No consolidation within the imaged lung apices. No visible  pneumothorax. IMPRESSION: CT head: No evidence of acute intracranial abnormality. CT cervical spine: 1. No evidence of acute fracture to the cervical spine. 2. Mild chronic appearing deformity of the anterosuperior C5 vertebral body, which may be degenerative or posttraumatic in etiology. 3. Trace C5-C6 grade 1 retrolisthesis. 4. Mild cervical spondylosis as described. Electronically Signed   By: Jackey Loge DO   On: 01/23/2020 18:44    Procedures .Marland KitchenLaceration Repair  Date/Time: 01/23/2020 6:13 PM Performed by: Eber Hong, MD Authorized by: Eber Hong, MD   Consent:    Consent obtained:  Verbal   Consent given by:  Patient   Risks discussed:  Infection, pain, need for additional repair, poor cosmetic result and poor wound healing   Alternatives discussed:  No treatment and delayed treatment Universal protocol:    Patient identity confirmed:  Verbally with patient Anesthesia:    Anesthesia method:  Local infiltration   Local anesthetic:  Lidocaine 1% WITH epi Laceration details:    Location:  Face   Face location:  Chin   Length (cm):  2   Depth (mm):  3 Pre-procedure details:    Preparation:  Patient was prepped and draped in usual sterile fashion and imaging obtained to evaluate for foreign bodies Exploration:    Hemostasis achieved with:  Direct pressure   Wound exploration: wound explored through full range of motion     Wound extent: no fascia violation noted, no foreign bodies/material noted, no muscle damage noted, no nerve damage noted, no tendon damage  noted, no underlying fracture noted and no vascular damage noted   Treatment:    Area cleansed with:  Betadine   Amount of cleaning:  Standard   Irrigation solution:  Sterile saline   Irrigation volume:  100   Irrigation method:  Syringe   Debridement:  None   Undermining:  None Skin repair:    Repair method:  Sutures   Suture size:  5-0   Suture material:  Prolene   Suture technique:  Simple interrupted   Number of sutures:  3 Approximation:    Approximation:  Close Repair type:    Repair type:  Simple Post-procedure details:    Dressing:  Antibiotic ointment and sterile dressing   Procedure completion:  Tolerated well, no immediate complications Comments:         (including critical care time)  Medications Ordered in ED Medications  bacitracin ointment 1 application (has no administration in time range)  LORazepam (ATIVAN) injection 1 mg (1 mg Intravenous Given 01/23/20 1802)  lidocaine-EPINEPHrine (XYLOCAINE W/EPI) 1 %-1:100000 (with pres) injection 10 mL (10 mLs Other Given 01/23/20 1802)  Tdap (BOOSTRIX) injection 0.5 mL (0.5 mLs Intramuscular Given 01/23/20 1803)    ED Course  I have reviewed the triage vital signs and the nursing notes.  Pertinent labs & imaging results that were available during my care of the patient were reviewed by me and considered in my medical decision making (see chart for details).    MDM Rules/Calculators/A&P                          This patient will need a primary laceration repair, we will update tetanus, CT scan of the brain to look for signs of trauma with the fall and the seizure, I suspect there was a fall given the laceration of the chin.  There is no family members here at this time, we will try to investigate to see how the seizure  occurred, this may be related to Xanax withdrawal, and or alcohol withdrawal, and or other cause.  Patient agreeable to the work-up normal mental status no seizures normal vitals.  Pt has had no  further seizures, lab work-up was unremarkable, CT scan of the head and cervical spine were also unremarkable, I personally viewed these images and agree there is no signs of acute trauma of lesions that would have caused the seizure.  The sister is now at the bedside and agreeable to help make sure that this patient does not drive, does not take anybody else's medications and reduces the amount of alcohol that he drinks.  He does not appear to be in withdrawal, he has normal mental status, not tachycardic, normal blood pressure and is not diaphoretic.  I have given him to follow-up for neurology, he is stable for discharge, no indication for starting antiepileptics at this time.  He has been instructed on how to have the laceration cared for and had to have the sutures removed.  Final Clinical Impression(s) / ED Diagnoses Final diagnoses:  Seizure-like activity (HCC)  Laceration of chin without complication, initial encounter      Eber Hong, MD 01/23/20 1924

## 2020-01-23 NOTE — ED Notes (Signed)
Patient transported to CT 

## 2020-01-23 NOTE — Discharge Instructions (Addendum)
Have the stitches taken out at your doctor or the Urgent Care in the next 7 days.  You can keep the area clean and dry - apply a sterile dressing after you apply topical antibiotics such as neosporin. You have likely had a seizure - this caused some injury to your tongue which will heal over the next week or 2, Tylenol or ibuprofen for pain, do not take anybody else's prescription medications especially Xanax and try to reduce the amount of alcohol that you take. You will need to follow-up with a neurologist to see why the seizure occurred.  Thankfully the CT scan of your brain and your neck is normal without any signs of tumors or bleeding or aneurysms.  It is very possible that you may have another seizure, if that occurs she will need to come back to the ER immediately.   Please be aware that it is illegal for you to drive a vehicle until you have been cleared by the neurologist in the state of [place].  I have given you the phone number above to establish care with neurology group, call for an appointment first thing on Monday morning

## 2020-01-23 NOTE — ED Notes (Signed)
Jackelyn Poling, sister, called for patient status. Her number is [phone].

## 2020-01-23 NOTE — ED Notes (Signed)
Pt back from CT

## 2020-01-26 ENCOUNTER — Encounter: Payer: Self-pay | Admitting: Neurology

## 2020-02-04 ENCOUNTER — Other Ambulatory Visit: Payer: Self-pay

## 2020-02-04 ENCOUNTER — Ambulatory Visit (INDEPENDENT_AMBULATORY_CARE_PROVIDER_SITE_OTHER): Payer: Self-pay | Admitting: Neurology

## 2020-02-04 ENCOUNTER — Encounter: Payer: Self-pay | Admitting: Neurology

## 2020-02-04 VITALS — BP 120/78 | HR 98 | Resp 18 | Ht 67.0 in | Wt 137.0 lb

## 2020-02-04 DIAGNOSIS — R569 Unspecified convulsions: Secondary | ICD-10-CM

## 2020-02-04 NOTE — Patient Instructions (Addendum)
Good to meet you!  1. Schedule MRI brain with and without contrast  2. Schedule 1-hour EEG  3. Follow-up in 6 months, call for any changes   Seizure Precautions: 1. If medication has been prescribed for you to prevent seizures, take it exactly as directed.  Do not stop taking the medicine without talking to your doctor first, even if you have not had a seizure in a long time.   2. Avoid activities in which a seizure would cause danger to yourself or to others.  Don't operate dangerous machinery, swim alone, or climb in high or dangerous places, such as on ladders, roofs, or girders.  Do not drive unless your doctor says you may.  3. If you have any warning that you may have a seizure, lay down in a safe place where you can't hurt yourself.    4.  No driving for 6 months from last seizure, as per [facility].   Please refer to the following link on the Epile[employer] of America's website for more information: http://www.epilepsyfoundation.org/answerplace/Social/driving/drivingu.cfm   5.  Maintain good sleep hygiene. Avoid alcohol.  6.  Contact your doctor if you have any problems that may be related to the medicine you are taking.  7.  Call 911 and bring the patient back to the ED if:        A.  The seizure lasts longer than 5 minutes.       B.  The patient doesn't awaken shortly after the seizure  C.  The patient has new problems such as difficulty seeing, speaking or moving  D.  The patient was injured during the seizure  E.  The patient has a temperature over 102 F (39C)  F.  The patient vomited and now is having trouble breathing        We have sent a referral to [facility] for your MRI and they will call you directly to schedule your appointment. They are located at [address] [facility]. If you need to contact them directly please call [phone].

## 2020-02-04 NOTE — Progress Notes (Signed)
NEUROLOGY CONSULTATION NOTE  Yunus Stoklosa MRN: [id number] DOB: [birth year]  Referring provider: Dr. Eber Hong Primary care provider: none listed  Reason for consult:  seizure  Dear Dr Hyacinth Meeker:  Thank you for your kind referral of Devyon Keator for consultation of the above symptoms. Although his history is well known to you, please allow me to reiterate it for the purpose of our medical record. He is alone in the office today. Records and images were personally reviewed where available.   HISTORY OF PRESENT ILLNESS: This is a pleasant 29 year old right-handed man with a history of recurrent Bell's palsy with residual deficits presenting for evaluation of new onset seizure that occurred on 01/23/2020. He recalls taking a quick drink out of a small airplane alcohol bottle, walking into the house, then waking up with EMS around him. His brother told him out of nowhere he suddenly looked up, spun around, then fell with generalized shaking. He had a chin laceration and bit his tongue. He states he was not confused when EMS arrived and could answer questions. No focal weakness, but the next day he felt like he was beat up. He had been taking his sister's Xanax daily for 2 weeks then stopped it 4 days prior to the seizure. Sleep was disrupted due to Xanax. CBC, BMP were normal. I personally reviewed head CT which did not show any acute changes.   He denies any staring/unresponsive episodes, gaps in time, olfactory/gustatory hallucinations, rising epigastric sensation, focal numbness/tingling/weakness, myoclonic jerks. He denies any headaches, dizziness, vision changes, bowel/bladder dysfunction. He had right-sided Bell's palsy in 2016, then left-sided Bell's palsy in July 2021 with full recovery. 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 He had a normal birth and early development.  There is no history of febrile convulsions, CNS infections such as meningitis/encephalitis, significant  traumatic brain injury, neurosurgical procedures, or family history of seizures.  PAST MEDICAL HISTORY: Past Medical History:  Diagnosis Date  . Bell's palsy     PAST SURGICAL HISTORY: No past surgical history on file.  MEDICATIONS: No current outpatient medications on file prior to visit.   No current facility-administered medications on file prior to visit.    ALLERGIES: No Known Allergies  FAMILY HISTORY: Family History  Family history unknown: Yes    SOCIAL HISTORY: Social History   Socioeconomic History  . Marital status: Single    Spouse name: Not on file  . Number of children: Not on file  . Years of education: Not on file  . Highest education level: Not on file  Occupational History  . Not on file  Tobacco Use  . Smoking status: Current Some Day Smoker    Last attempt to quit: 09/19/2014    Years since quitting: 5.3  . Smokeless tobacco: Never Used  Vaping Use  . Vaping Use: Never used  Substance and Sexual Activity  . Alcohol use: Yes    Alcohol/week: 0.0 standard drinks    Comment: social  . Drug use: Yes    Types: Marijuana    Comment: xanax on occ   . Sexual activity: Not on file  Other Topics Concern  . Not on file  Social History Narrative  . Not on file   Social Determinants of Health   Financial Resource Strain: Not on file  Food Insecurity: Not on file  Transportation Needs: Not on file  Physical Activity: Not on file  Stress: Not on file  Social Connections: Not on file  Intimate Partner Violence: Not on file     PHYSICAL EXAM: Vitals:   02/04/20 1304  BP: 120/78  Pulse: 98  Resp: 18  SpO2: 99%   General: No acute distress Head:  Normocephalic/atraumatic Skin/Extremities: No rash, no edema Neurological Exam: Mental status: alert and oriented to person, place, and time, no dysarthria or aphasia, Fund of knowledge is appropriate.  Recent and remote memory are intact.  Attention and concentration are normal.    Cranial  nerves: CN I: not tested CN II: pupils equal, round and reactive to light, visual fields intact CN III, IV, VI:  full range of motion, no nystagmus, no ptosis CN V: facial sensation intact CN VII: upper and lower face symmetric CN VIII: hearing intact to conversation Bulk & Tone: normal, no fasciculations. Motor: 5/5 throughout with no pronator drift. Sensation: intact to light touch, cold, vibration sense.  No extinction to double simultaneous stimulation.  Romberg test negative Deep Tendon Reflexes: +2 throughout Cerebellar: no incoordination on finger to nose testing Gait: narrow-based and steady, able to tandem walk adequately. Tremor: none   IMPRESSION: This is a pleasant 29 year old right-handed man with a history of recurrent Bell's palsy (2016, 2021) with no residual deficits, presenting for evaluation of new onset seizure that occurred on 01/23/20. His description raises concern for a focal seizure that secondarily generalized. There is a possibility that benzodiazepine withdrawal is involved, however due to seizure semiology, would do MRI brain with and without contrast and 1-hour EEG to assess for focal abnormalities that increase risk for recurrent seizure. We discussed that after an initial seizure, unless there are significant risk factors, an abnormal neurological exam, an EEG showing epileptiform abnormalities, and/or abnormal neuroimaging, treatment with an antiepileptic drug is not indicated. We discussed 10% of the population may have a single seizure. Patients with a single unprovoked seizure have a recurrence rate of 33% after a single seizure and 73% after a second seizure. We discussed [place] driving restrictions which indicate a patient needs to free of seizures or events of altered awareness for 6 months prior to resuming driving. The patient agreed to comply with these restrictions. We discussed avoidance of seizure triggers, including sudden discontinuation of benzodiazepines,  alcohol, and sleep deprivation. Follow-up in 6 months, he knows to call for any changes.   Thank you for allowing me to participate in the care of this patient. Please do not hesitate to call for any questions or concerns.   Patrcia Dolly, M.D.  CC: Dr. Hyacinth Meeker

## 2020-02-18 ENCOUNTER — Other Ambulatory Visit: Payer: Self-pay

## 2020-02-25 ENCOUNTER — Other Ambulatory Visit: Payer: Self-pay

## 2020-03-03 ENCOUNTER — Other Ambulatory Visit: Payer: Self-pay

## 2020-03-17 ENCOUNTER — Inpatient Hospital Stay: Admission: RE | Admit: 2020-03-17 | Payer: Self-pay | Source: Ambulatory Visit

## 2020-04-15 ENCOUNTER — Ambulatory Visit: Payer: Self-pay | Admitting: Neurology

## 2020-06-07 ENCOUNTER — Other Ambulatory Visit: Payer: Self-pay

## 2020-06-07 ENCOUNTER — Encounter (HOSPITAL_BASED_OUTPATIENT_CLINIC_OR_DEPARTMENT_OTHER): Payer: Self-pay | Admitting: Emergency Medicine

## 2020-06-07 ENCOUNTER — Other Ambulatory Visit (HOSPITAL_BASED_OUTPATIENT_CLINIC_OR_DEPARTMENT_OTHER): Payer: Self-pay

## 2020-06-07 ENCOUNTER — Emergency Department (HOSPITAL_BASED_OUTPATIENT_CLINIC_OR_DEPARTMENT_OTHER)
Admission: EM | Admit: 2020-06-07 | Discharge: 2020-06-07 | Disposition: A | Discharge status: Home / Self Care | Payer: Self-pay | Attending: Emergency Medicine | Admitting: Emergency Medicine

## 2020-06-07 DIAGNOSIS — F17200 Nicotine dependence, unspecified, uncomplicated: Secondary | ICD-10-CM | POA: Insufficient documentation

## 2020-06-07 DIAGNOSIS — K6289 Other specified diseases of anus and rectum: Secondary | ICD-10-CM | POA: Insufficient documentation

## 2020-06-07 MED ORDER — SULFAMETHOXAZOLE-TRIMETHOPRIM 800-160 MG PO TABS
1.0000 | ORAL_TABLET | Freq: Two times a day (BID) | ORAL | 0 refills | Status: AC
  Filled 2020-06-07: qty 14, 7d supply, fill #0

## 2020-06-07 NOTE — Discharge Instructions (Addendum)
You were seen in the emergency department for painful lump near your anus.  This is likely an early abscess.  We are starting you on some antibiotics and you can do warm compresses to the area.  Take ibuprofen or other anti-inflammatories for pain.  If this worsens you likely will need drainage.  Return to the emergency department if any problems.

## 2020-06-07 NOTE — ED Triage Notes (Signed)
Patient complaining of cystic like area to left of medial buttock fold. Was last drained x 2 months ago and has returned.

## 2020-06-07 NOTE — ED Provider Notes (Signed)
[facility] EMERGENCY DEPARTMENT Provider Note   CSN: [id number] Arrival date & time: 06/07/20  7741     History Chief Complaint  Patient presents with  . Buttock boil    Tommy Henderson is a 30 y.o. male.  He is here for tender area on his left buttock near his anus.  Its been there for 2 days.  Worse when he sits on it.  He had a similar problem months ago and it was drained.  He was put on Bactrim after.  He is having no problems moving his bowels.  No fever.  The history is provided by the patient.  Abscess Location:  Pelvis Pelvic abscess location:  Perianal Size:  1 Abscess quality: painful   Abscess quality: not draining, no fluctuance, no induration, no redness and no warmth   Red streaking: no   Progression:  Unchanged Pain details:    Quality:  Dull   Severity:  Mild   Progression:  Unchanged Chronicity:  Recurrent Relieved by:  None tried Exacerbated by: sitting. Ineffective treatments:  None tried Associated symptoms: no fever and no vomiting   Risk factors: prior abscess        Past Medical History:  Diagnosis Date  . Bell's palsy   . Hx of Bell's palsy     Patient Active Problem List   Diagnosis Date Noted  . Bell's palsy 11/19/2014    History reviewed. No pertinent surgical history.     Family History  Family history unknown: Yes    Social History   Tobacco Use  . Smoking status: Current Some Day Smoker    Last attempt to quit: 09/19/2014    Years since quitting: 5.7  . Smokeless tobacco: Never Used  Vaping Use  . Vaping Use: Never used  Substance Use Topics  . Alcohol use: Yes    Alcohol/week: 0.0 standard drinks    Comment: social  . Drug use: Yes    Types: Marijuana    Comment: xanax on occ     Home Medications Prior to Admission medications   Not on File    Allergies    Patient has no known allergies.  Review of Systems   Review of Systems  Constitutional: Negative for fever.  Gastrointestinal: Negative for  blood in stool and vomiting.  Skin: Negative for wound.    Physical Exam Updated Vital Signs BP 134/90 (BP Location: Right Arm)   Pulse 75   Temp 98.2 F (36.8 C) (Oral)   Resp 20   Ht 5\' 7"  (1.702 m)   Wt 64.9 kg   SpO2 100%   BMI 22.40 kg/m   Physical Exam Vitals and nursing note reviewed.  Constitutional:      Appearance: He is well-developed.  HENT:     Head: Normocephalic and atraumatic.  Eyes:     Conjunctiva/sclera: Conjunctivae normal.  Pulmonary:     Effort: Pulmonary effort is normal.  Abdominal:     Tenderness: There is no abdominal tenderness. There is no guarding or rebound.  Genitourinary:    Comments: He is approximately 1 cm tender mass perianal on the left side with no fluctuance.  No overlying erythema or open sores. Musculoskeletal:     Cervical back: Neck supple.  Skin:    General: Skin is warm and dry.  Neurological:     Mental Status: He is alert.     GCS: GCS eye subscore is 4. GCS verbal subscore is 5. GCS motor subscore is 6.  ED Results / Procedures / Treatments   Labs (all labs ordered are listed, but only abnormal results are displayed) Labs Reviewed - No data to display  EKG None  Radiology No results found.  Procedures Procedures   Medications Ordered in ED Medications - No data to display  ED Course  I have reviewed the triage vital signs and the nursing notes.  Pertinent labs & imaging results that were available during my care of the patient were reviewed by me and considered in my medical decision making (see chart for details).  Clinical Course as of 06/07/20 1721  Mon Jun 07, 2020  6734 Clinically has an early perianal abscess.  I think it is too small to drain currently and he is agreeable to trial of antibiotics and warm compress.  Return instructions discussed [MB]    Clinical Course User Index [MB] Terrilee Files, MD   MDM Rules/Calculators/A&P                         Differential diagnosis includes  cellulitis, phlegmon, abscess.  Currently I feel this is too deep and small to attempt drainage.  We will cover with amtibiotics.  Return instructions discussed. Final Clinical Impression(s) / ED Diagnoses Final diagnoses:  Perianal mass    Rx / DC Orders ED Discharge Orders         Ordered    sulfamethoxazole-trimethoprim (BACTRIM DS) 800-160 MG tablet  2 times daily        06/07/20 0907           Terrilee Files, MD 06/07/20 [phone]

## 2020-10-11 ENCOUNTER — Ambulatory Visit: Payer: Self-pay | Admitting: Neurology

## 2021-02-19 IMAGING — CT CT HEAD W/O CM
4 series · 15 of 47 positions shown, 17 images · non-contrast
Comparison: Report from radiographs of the cervical spine
04/26/2008 (images unavailable).

CLINICAL DATA: Seizure, acute, history of trauma. Polytrauma,
critical, head/cervical spine injury suspected. Additional history
provided: Seizure after fall.

EXAM:
CT HEAD WITHOUT CONTRAST
CT CERVICAL SPINE WITHOUT CONTRAST
TECHNIQUE: Multidetector CT imaging of the head and cervical spine was
performed following the standard protocol without intravenous
contrast. Multiplanar CT image reconstructions of the cervical spine
were also generated.

[Series 3: head without · axial · non-contrast · 0.43mm/px · z∈[-178,-58]mm · 7 of 33 slices shown, 9 images]
[im 5/33  brain]
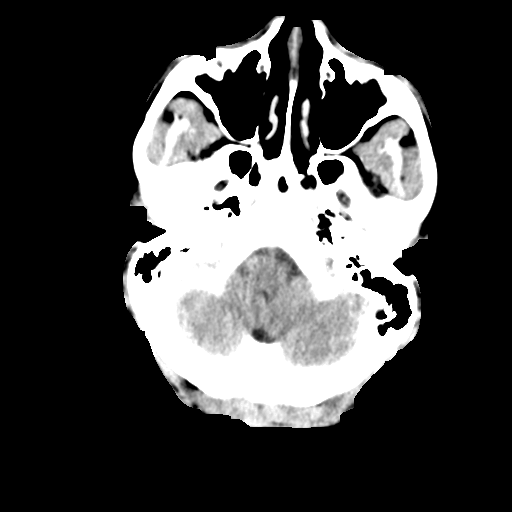
[im 5/33  bone]
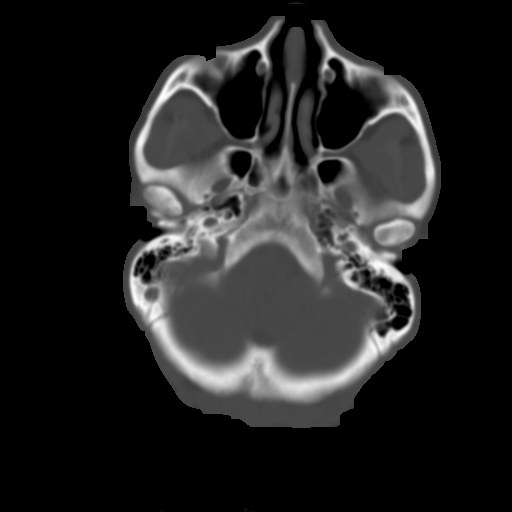
[im 9/33  brain]
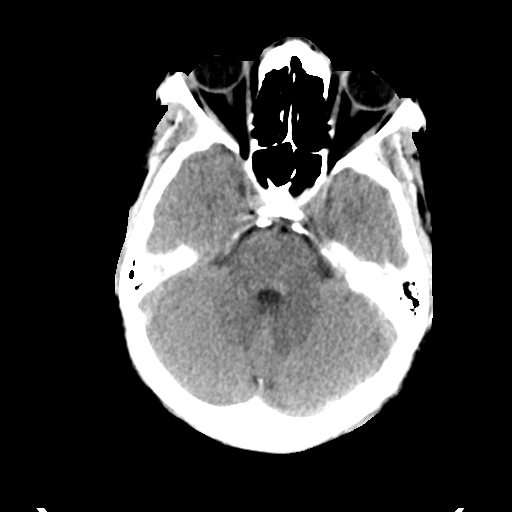
[im 13/33  brain]
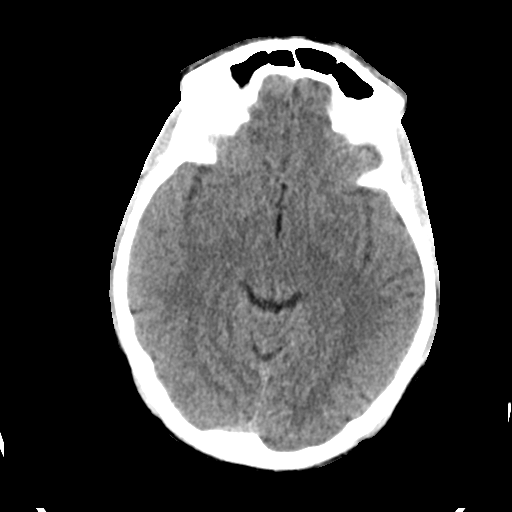
[im 17/33  brain]
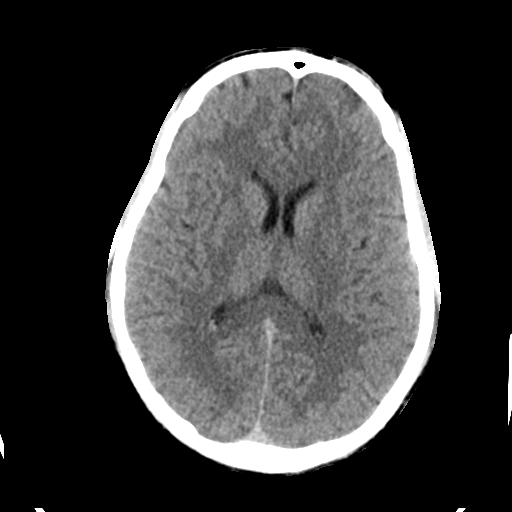
[im 21/33  brain]
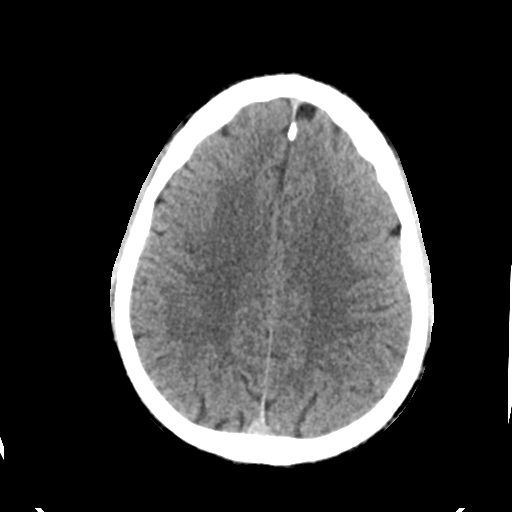
[im 21/33  bone]
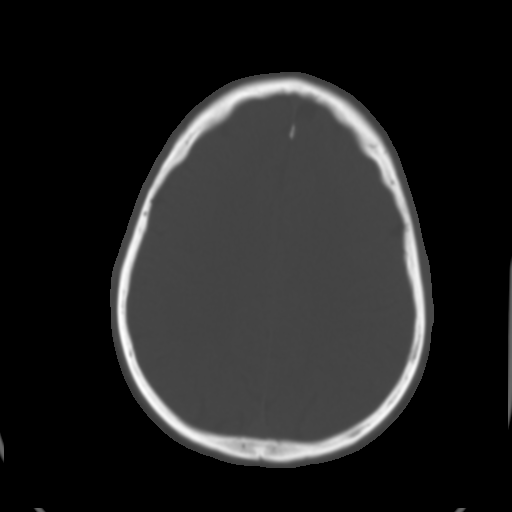
[im 25/33  brain]
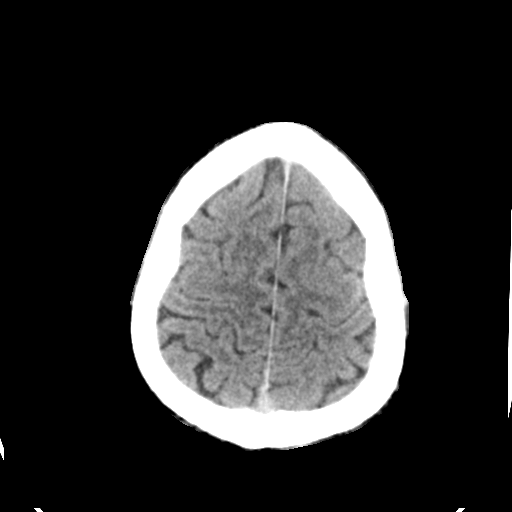
[im 29/33  brain]
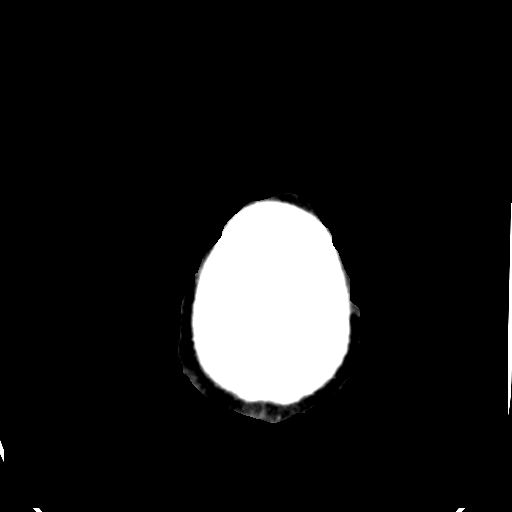

[Series 4: head bone · axial · 0.43mm/px · z∈[-182,-166]mm · 2 of 82 slices shown]
[im 9/82  bone]
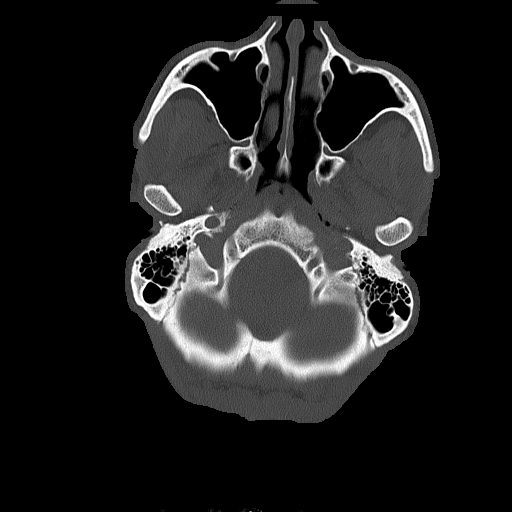
[im 17/82  bone]
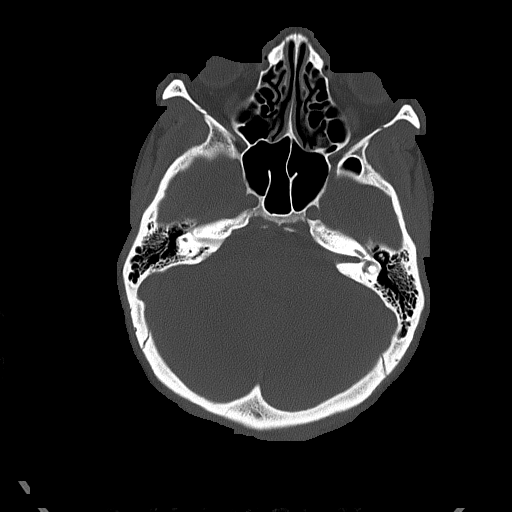

[Series 5: head without cor · coronal · non-contrast · 0.32mm/px · 3 of 69 slices shown]
[im 23/69  brain]
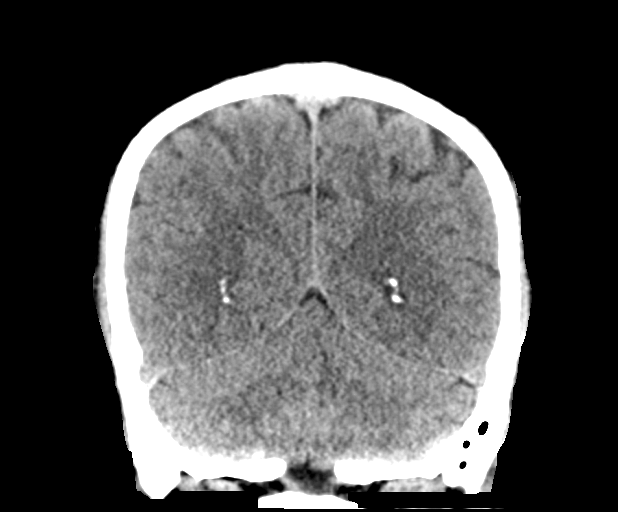
[im 31/69  brain]
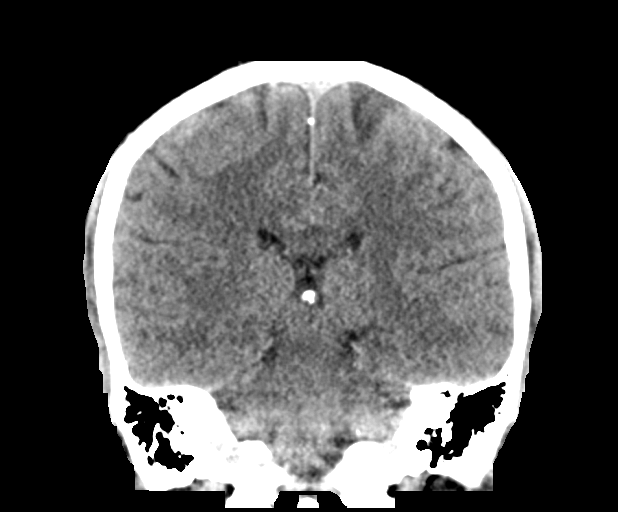
[im 38/69  brain]
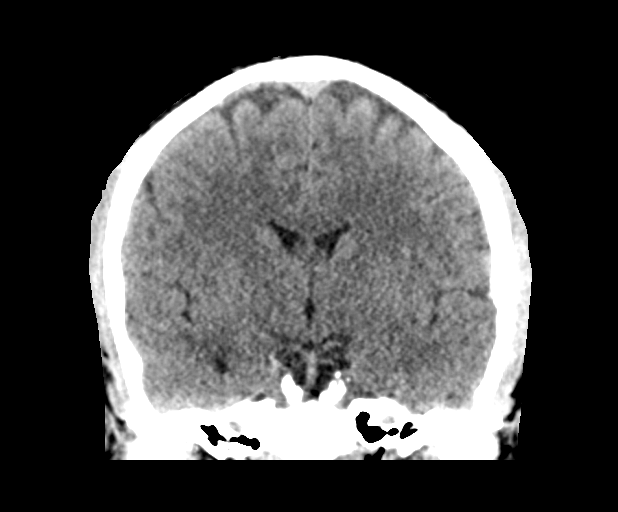

[Series 6: head without sag · sagittal · non-contrast · 0.31mm/px · 3 of 67 slices shown]
[im 23/67  brain]
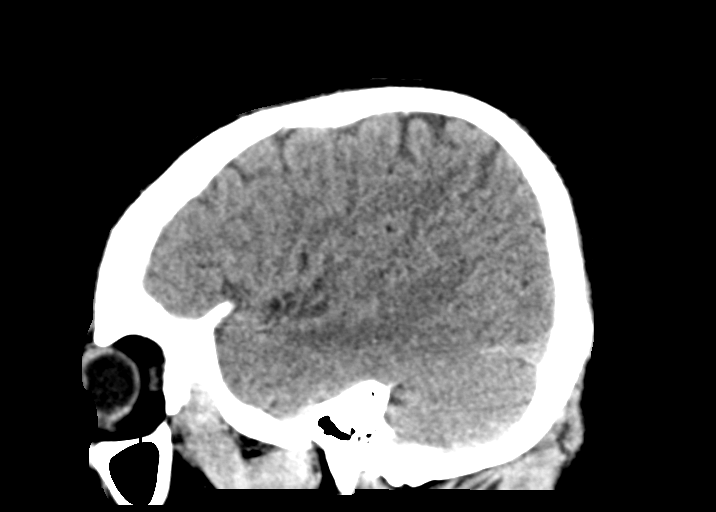
[im 34/67  brain]
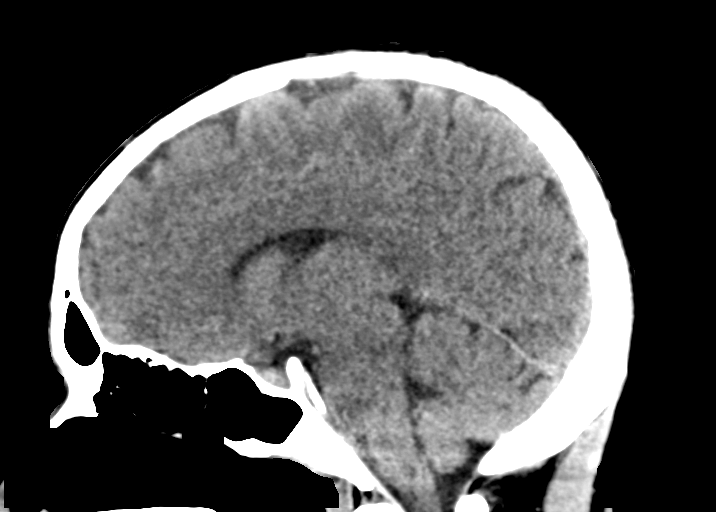
[im 45/67  brain]
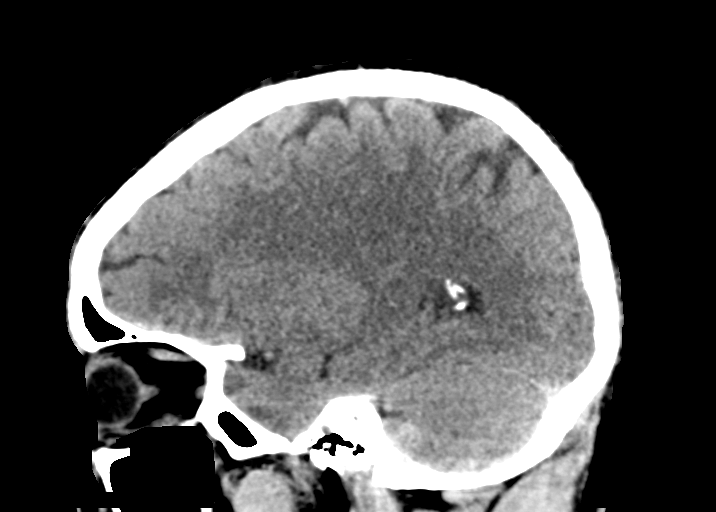

[15 of 47 positions shown; findings below may reference images not displayed]

FINDINGS: CT HEAD FINDINGS

Brain:

Cerebral volume is normal.

There is no acute intracranial hemorrhage.

No demarcated cortical infarct.

No extra-axial fluid collection.

No evidence of intracranial mass.

No midline shift.

Vascular: No hyperdense vessel.

Skull: Normal. Negative for fracture or focal lesion.

Sinuses/Orbits: Visualized orbits show no acute finding. No
significant paranasal sinus disease at the imaged levels.

CT CERVICAL SPINE FINDINGS

Alignment: Straightening of the expected cervical lordosis. Trace
C5-C6 grade 1 retrolisthesis.

Skull base and vertebrae: The basion-dental and atlanto-dental
intervals are maintained.No evidence of acute fracture to the
cervical spine. Mild chronic appearing deformity of the
anterosuperior C5 vertebral body, which may be degenerative or
posttraumatic in etiology (series 9, image 33).

Soft tissues and spinal canal: No prevertebral fluid or swelling. No
visible canal hematoma.

Disc levels: No more than mild disc space narrowing at any level.
Multilevel disc bulges with no more than mild appreciable spinal
canal stenosis.

Upper chest: No consolidation within the imaged lung apices. No
visible pneumothorax.
IMPRESSION: CT head:

No evidence of acute intracranial abnormality.

CT cervical spine:

1. No evidence of acute fracture to the cervical spine.
2. Mild chronic appearing deformity of the anterosuperior C5
vertebral body, which may be degenerative or posttraumatic in
etiology.
3. Trace C5-C6 grade 1 retrolisthesis.
4. Mild cervical spondylosis as described.

## 2021-02-19 IMAGING — CT CT CERVICAL SPINE W/O CM
3 of 4 series · 12 of 33 positions shown, 14 images · non-contrast
Comparison: Report from radiographs of the cervical spine
04/26/2008 (images unavailable).

CLINICAL DATA: Seizure, acute, history of trauma. Polytrauma,
critical, head/cervical spine injury suspected. Additional history
provided: Seizure after fall.

EXAM:
CT HEAD WITHOUT CONTRAST
CT CERVICAL SPINE WITHOUT CONTRAST
TECHNIQUE: Multidetector CT imaging of the head and cervical spine was
performed following the standard protocol without intravenous
contrast. Multiplanar CT image reconstructions of the cervical spine
were also generated.

[Series 4: c_spine 2.0 orthogonals · axial · 0.21mm/px · z∈[-364,-235]mm · 4 of 97 slices shown, 5 images]
[im 17/97  soft-tissue]
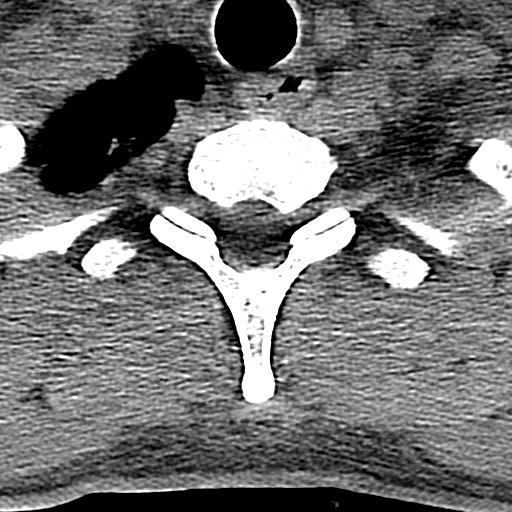
[im 17/97  bone]
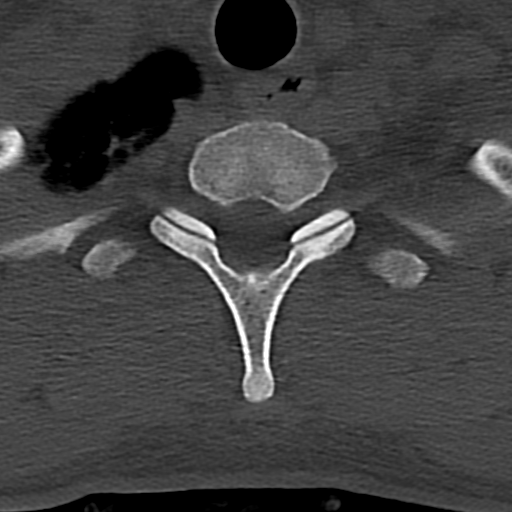
[im 33/97  bone]
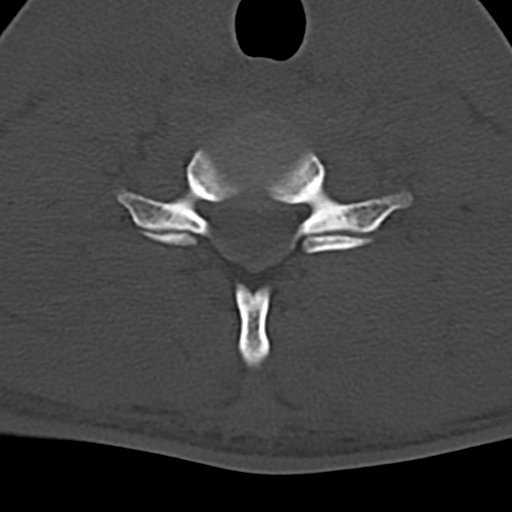
[im 65/97  bone]
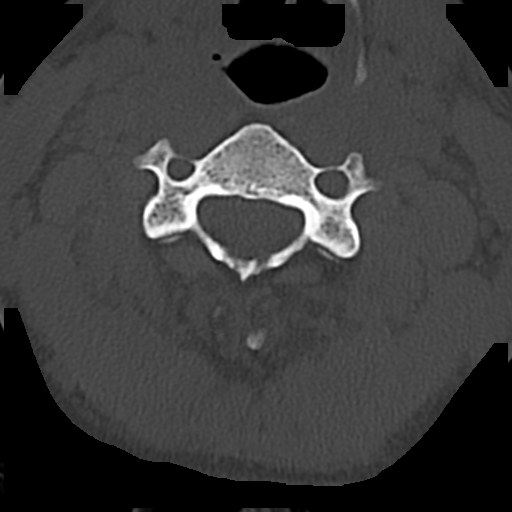
[im 81/97  bone]
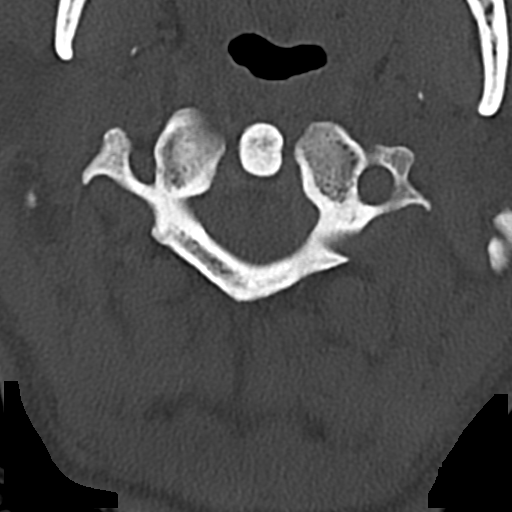

[Series 9: c_spine 2.0 sag bone · sagittal · 0.29mm/px · 5 of 61 slices shown, 6 images]
[im 21/61  bone]
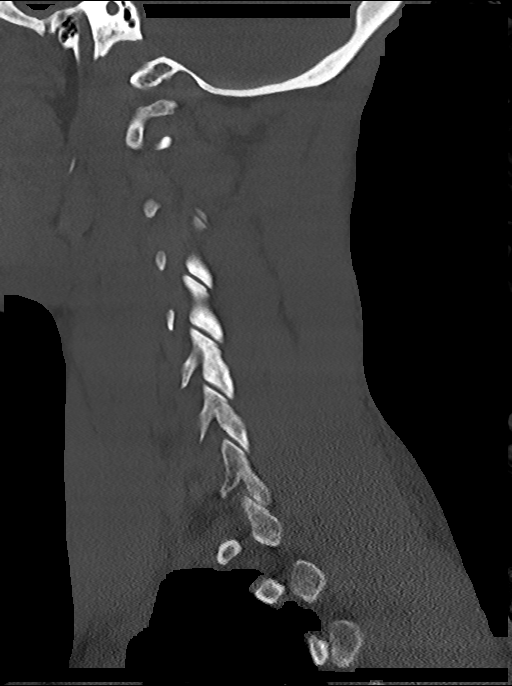
[im 26/61  bone]
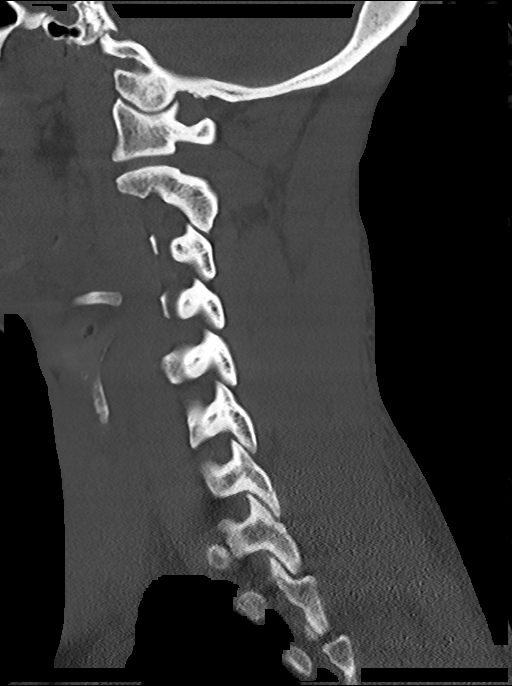
[im 31/61  soft-tissue]
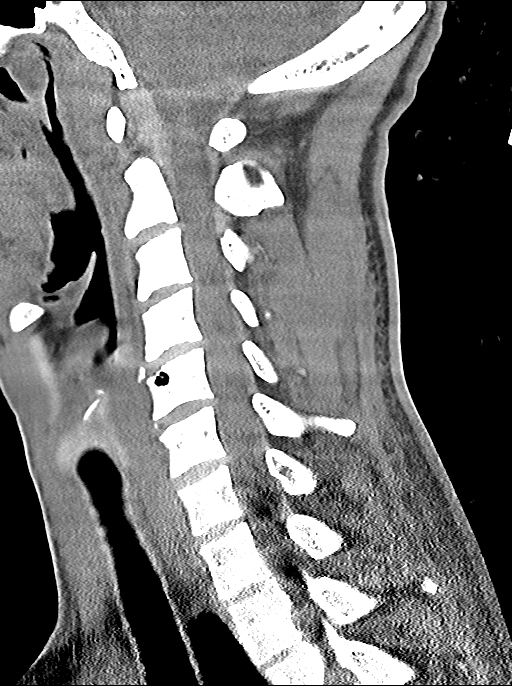
[im 31/61  bone]
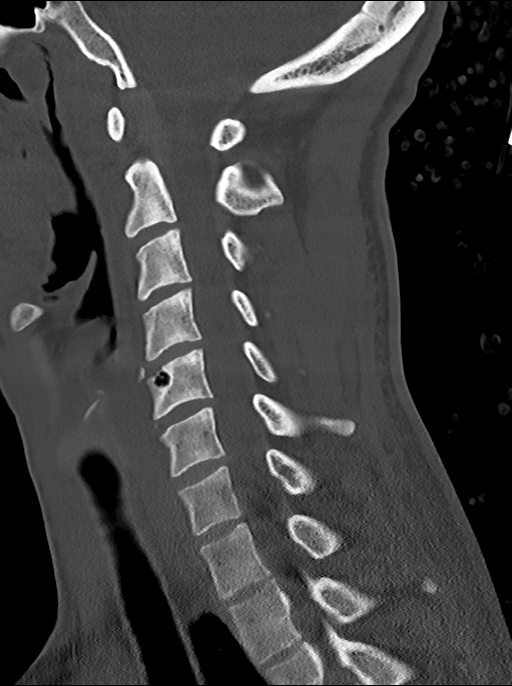
[im 36/61  bone]
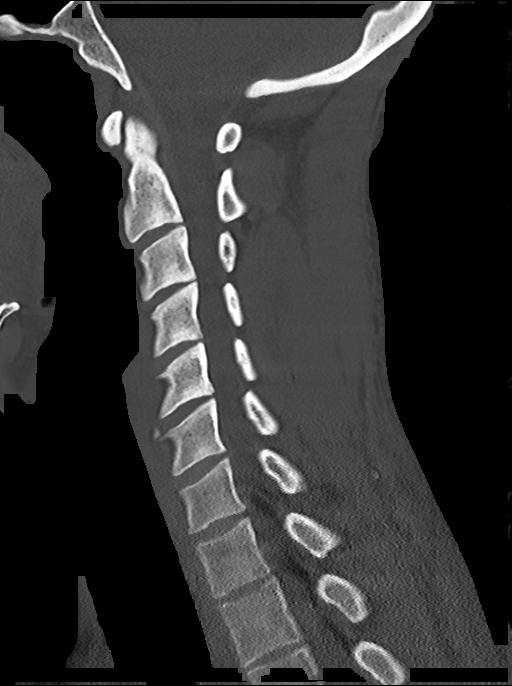
[im 41/61  bone]
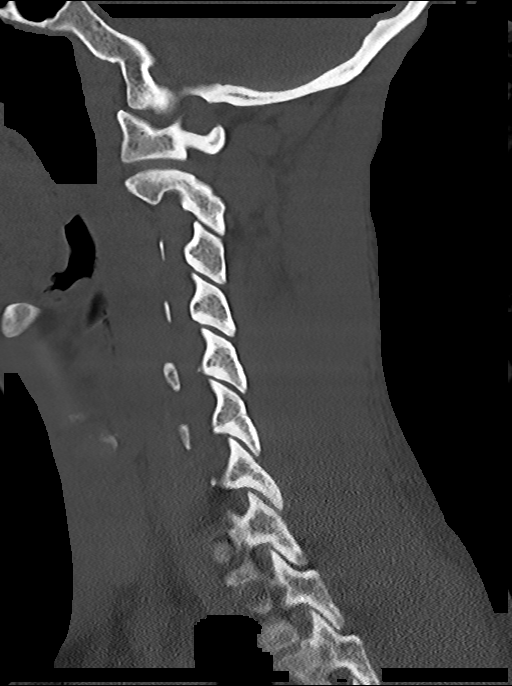

[Series 10: c_spine 2.0 cor bone · coronal · 0.23mm/px · 3 of 68 slices shown]
[im 14/68  bone]
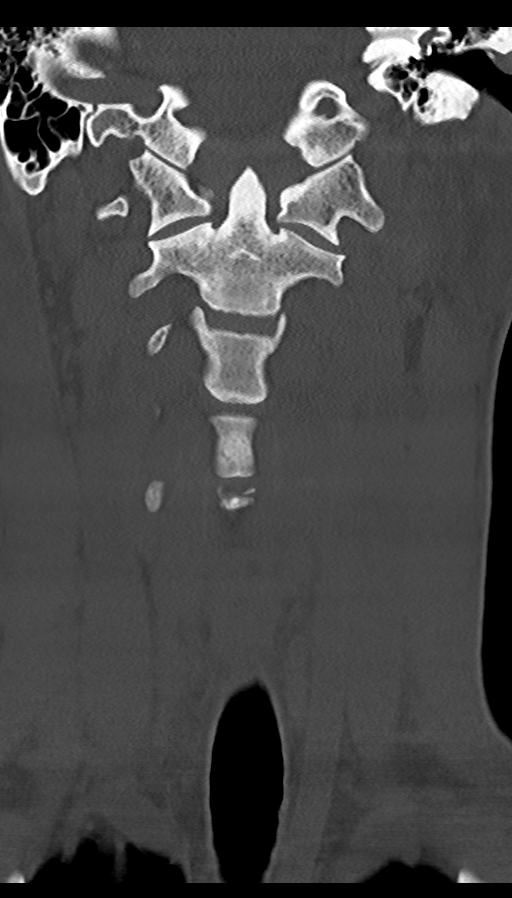
[im 27/68  bone]
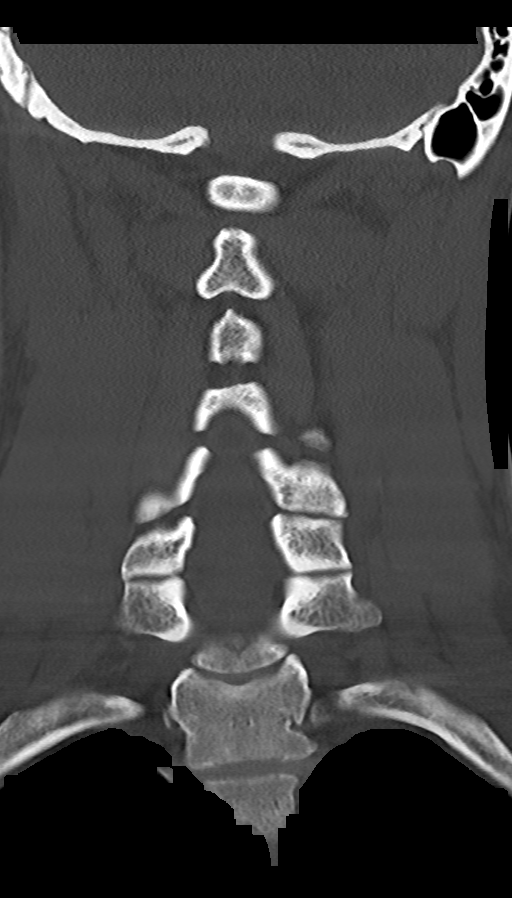
[im 41/68  bone]
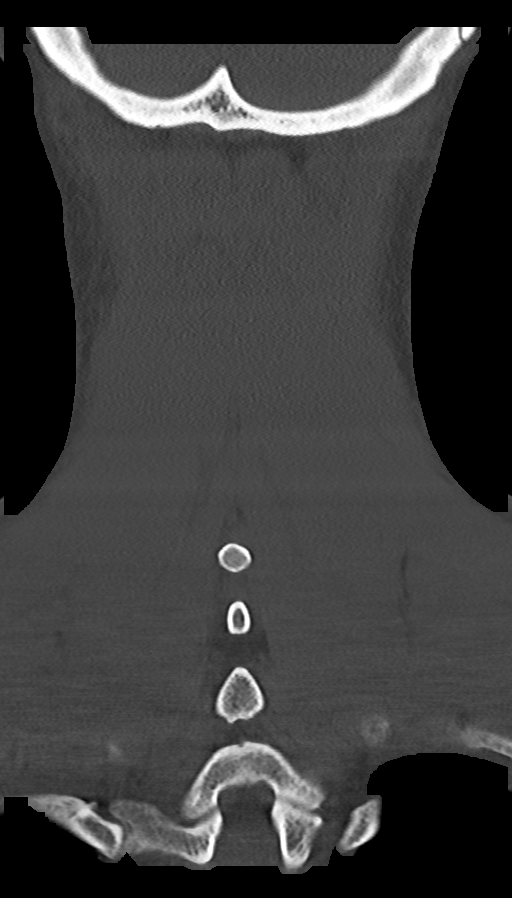

[12 of 33 positions shown; findings below may reference images not displayed]

FINDINGS: CT HEAD FINDINGS

Brain:

Cerebral volume is normal.

There is no acute intracranial hemorrhage.

No demarcated cortical infarct.

No extra-axial fluid collection.

No evidence of intracranial mass.

No midline shift.

Vascular: No hyperdense vessel.

Skull: Normal. Negative for fracture or focal lesion.

Sinuses/Orbits: Visualized orbits show no acute finding. No
significant paranasal sinus disease at the imaged levels.

CT CERVICAL SPINE FINDINGS

Alignment: Straightening of the expected cervical lordosis. Trace
C5-C6 grade 1 retrolisthesis.

Skull base and vertebrae: The basion-dental and atlanto-dental
intervals are maintained.No evidence of acute fracture to the
cervical spine. Mild chronic appearing deformity of the
anterosuperior C5 vertebral body, which may be degenerative or
posttraumatic in etiology (series 9, image 33).

Soft tissues and spinal canal: No prevertebral fluid or swelling. No
visible canal hematoma.

Disc levels: No more than mild disc space narrowing at any level.
Multilevel disc bulges with no more than mild appreciable spinal
canal stenosis.

Upper chest: No consolidation within the imaged lung apices. No
visible pneumothorax.
IMPRESSION: CT head:

No evidence of acute intracranial abnormality.

CT cervical spine:

1. No evidence of acute fracture to the cervical spine.
2. Mild chronic appearing deformity of the anterosuperior C5
vertebral body, which may be degenerative or posttraumatic in
etiology.
3. Trace C5-C6 grade 1 retrolisthesis.
4. Mild cervical spondylosis as described.

## 2021-08-19 ENCOUNTER — Other Ambulatory Visit (HOSPITAL_BASED_OUTPATIENT_CLINIC_OR_DEPARTMENT_OTHER): Payer: Self-pay

## 2021-08-19 ENCOUNTER — Encounter (HOSPITAL_BASED_OUTPATIENT_CLINIC_OR_DEPARTMENT_OTHER): Payer: Self-pay

## 2021-08-19 ENCOUNTER — Other Ambulatory Visit: Payer: Self-pay

## 2021-08-19 ENCOUNTER — Emergency Department (HOSPITAL_BASED_OUTPATIENT_CLINIC_OR_DEPARTMENT_OTHER)
Admission: EM | Admit: 2021-08-19 | Discharge: 2021-08-19 | Disposition: A | Discharge status: Home / Self Care | Payer: Self-pay | Attending: Emergency Medicine | Admitting: Emergency Medicine

## 2021-08-19 DIAGNOSIS — E860 Dehydration: Secondary | ICD-10-CM | POA: Insufficient documentation

## 2021-08-19 DIAGNOSIS — R638 Other symptoms and signs concerning food and fluid intake: Secondary | ICD-10-CM | POA: Insufficient documentation

## 2021-08-19 DIAGNOSIS — R519 Headache, unspecified: Secondary | ICD-10-CM | POA: Insufficient documentation

## 2021-08-19 DIAGNOSIS — K5900 Constipation, unspecified: Secondary | ICD-10-CM | POA: Insufficient documentation

## 2021-08-19 LAB — CBC WITH DIFFERENTIAL/PLATELET
Abs Immature Granulocytes: 0.01 10*3/uL (ref 0.00–0.07)
Basophils Absolute: 0 10*3/uL (ref 0.0–0.1)
Basophils Relative: 1 %
Eosinophils Absolute: 0 10*3/uL (ref 0.0–0.5)
Eosinophils Relative: 1 %
HCT: 38.8 % — ABNORMAL LOW (ref 39.0–52.0)
Hemoglobin: 13.2 g/dL (ref 13.0–17.0)
Immature Granulocytes: 0 %
Lymphocytes Relative: 11 %
Lymphs Abs: 0.7 10*3/uL (ref 0.7–4.0)
MCH: 30.2 pg (ref 26.0–34.0)
MCHC: 34 g/dL (ref 30.0–36.0)
MCV: 88.8 fL (ref 80.0–100.0)
Monocytes Absolute: 0.5 10*3/uL (ref 0.1–1.0)
Monocytes Relative: 7 %
Neutro Abs: 5.1 10*3/uL (ref 1.7–7.7)
Neutrophils Relative %: 80 %
Platelets: 254 10*3/uL (ref 150–400)
RBC: 4.37 MIL/uL (ref 4.22–5.81)
RDW: 10.8 % — ABNORMAL LOW (ref 11.5–15.5)
WBC: 6.4 10*3/uL (ref 4.0–10.5)
nRBC: 0 % (ref 0.0–0.2)

## 2021-08-19 LAB — BASIC METABOLIC PANEL
Anion gap: 7 (ref 5–15)
BUN: 11 mg/dL (ref 6–20)
CO2: 26 mmol/L (ref 22–32)
Calcium: 8.6 mg/dL — ABNORMAL LOW (ref 8.9–10.3)
Chloride: 105 mmol/L (ref 98–111)
Creatinine, Ser: 0.84 mg/dL (ref 0.61–1.24)
GFR, Estimated: >60 mL/min (ref >60)
Glucose, Bld: 112 mg/dL — ABNORMAL HIGH (ref 70–99)
Potassium: 4.1 mmol/L (ref 3.5–5.1)
Sodium: 138 mmol/L (ref 135–145)

## 2021-08-19 LAB — MAGNESIUM: Magnesium: 2 mg/dL (ref 1.7–2.4)

## 2021-08-19 LAB — LIPASE, BLOOD: Lipase: 29 U/L (ref 11–51)

## 2021-08-19 MED ORDER — POLYETHYLENE GLYCOL 3350 17 GM/SCOOP PO POWD
17.0000 g | Freq: Every day | ORAL | 0 refills | Status: AC
  Filled 2021-08-19: qty 238, 14d supply, fill #0

## 2021-08-19 MED ORDER — ONDANSETRON HCL 4 MG/2ML IJ SOLN: 4.0000 mg | Freq: Once | INTRAMUSCULAR | Status: DC

## 2021-08-19 MED ORDER — SODIUM CHLORIDE 0.9 % IV BOLUS
1000.0000 mL | Freq: Once | INTRAVENOUS | Status: AC
  Administered 2021-08-19: 1000 mL via INTRAVENOUS

## 2021-08-19 MED ORDER — KETOROLAC TROMETHAMINE 15 MG/ML IJ SOLN
15.0000 mg | Freq: Once | INTRAMUSCULAR | Status: AC
  Administered 2021-08-19: 15 mg via INTRAVENOUS
  Filled 2021-08-19: qty 1

## 2021-08-19 NOTE — ED Triage Notes (Signed)
Pt reports HA , chills and nausea on the 3rd. Felt better the following day, but now has HA, unable to eat and has not been able to have BM

## 2021-08-19 NOTE — ED Provider Notes (Signed)
[facility] EMERGENCY DEPARTMENT Provider Note   CSN: [id number] Arrival date & time: 08/19/21  2440     History  Chief Complaint  Patient presents with   Headache    Tommy Henderson is a 31 y.o. male.  Tommy Henderson is a 31 year old male with history of Bell's palsy and possible isolated seizure presenting to the emergency department with headache, constipation, and low appetite for the past 2 days.  Patient states 4 days ago he woke up felt nauseous, had chills, a headache and vomited several times throughout the day.  The following day he states he felt well.  2 days ago he woke up with a headache and decreased appetite.  Headache described as a bandlike pressure around his forehead that is worse when he wakes up and gets better throughout the day.  He states he has not had a bowel movement in the past 4 days which is a departure from his normal daily bowel movement.  States he has been trying to drink more water but due to his decreased appetite is not eating or drinking very much.  He notes some dark urine but no dysuria and no abdominal pain.  He has been taking Xanax 0.5 mg 1-2 times a day as well has smoking marijuana daily, but this is not a new change.   Headache Associated symptoms: no abdominal pain, no cough, no diarrhea, no nausea, no sore throat, no vomiting and no weakness        Home Medications Prior to Admission medications   Medication Sig Start Date End Date Taking? Authorizing Provider  polyethylene glycol powder (SM CLEARLAX) 17 GM/SCOOP powder Mix  17 grams in 8 ounces of liquid and drink by mouth daily. 08/19/21  Yes Tommy Dada P, DO      Allergies    Patient has no known allergies.    Review of Systems   Review of Systems  Constitutional:  Positive for appetite change and chills. Negative for unexpected weight change.  HENT:  Negative for sore throat.   Eyes:  Negative for visual disturbance.  Respiratory:  Negative for cough and shortness of breath.    Cardiovascular:  Negative for chest pain.  Gastrointestinal:  Positive for constipation. Negative for abdominal pain, diarrhea, nausea and vomiting.  Genitourinary:  Negative for difficulty urinating and dysuria.  Neurological:  Positive for headaches. Negative for syncope and weakness.    Physical Exam Updated Vital Signs BP 125/74   Pulse 88   Temp 98.6 F (37 C) (Oral)   Resp 16   Ht 5\' 7"  (1.702 m)   Wt 68 kg   SpO2 100%   BMI 23.49 kg/m  Physical Exam Vitals reviewed.  Constitutional:      General: He is not in acute distress.    Appearance: He is well-developed.  Eyes:     General: No scleral icterus. Cardiovascular:     Rate and Rhythm: Normal rate and regular rhythm.  Pulmonary:     Effort: Pulmonary effort is normal.  Abdominal:     General: There is no distension.     Palpations: Abdomen is soft.     Tenderness: There is no abdominal tenderness. There is no guarding.  Musculoskeletal:     Cervical back: Normal range of motion.  Neurological:     Mental Status: He is alert.     Cranial Nerves: No dysarthria or facial asymmetry.  Psychiatric:        Mood and Affect: Mood normal.  Behavior: Behavior normal.     ED Results / Procedures / Treatments   Labs (all labs ordered are listed, but only abnormal results are displayed) Labs Reviewed  BASIC METABOLIC PANEL - Abnormal; Notable for the following components:      Result Value   Glucose, Bld 112 (*)    Calcium 8.6 (*)    All other components within normal limits  CBC WITH DIFFERENTIAL/PLATELET - Abnormal; Notable for the following components:   HCT 38.8 (*)    RDW 10.8 (*)    All other components within normal limits  LIPASE, BLOOD  MAGNESIUM    EKG None  Radiology No results found.  Procedures Procedures    Medications Ordered in ED Medications  sodium chloride 0.9 % bolus 1,000 mL (0 mLs Intravenous Stopped 08/19/21 0904)  ketorolac (TORADOL) 15 MG/ML injection 15 mg (15 mg  Intravenous Given 08/19/21 0807)    ED Course/ Medical Decision Making/ A&Henderson                           Medical Decision Making Tommy Henderson is a 31 year old male presenting with 4 days of constipation, tension headache, and decreased oral intake.  Patient appears well with stable vitals, no acute or worsening abdominal pain, no abdominal distention, and no gross neurologic deficits.  Low suspicion for small bowel obstruction, acute abdominal inflammatory process, or acute neurological process.  Labs were ordered and interpreted.  CBC, BMP, lipase, magnesium checked and IV fluids and Toradol administrated.  Work-up was unremarkable and patient was discharged in stable condition with prescription for MiraLAX and instructions to maintain adequate hydration.  Amount and/or Complexity of Data Reviewed Labs: ordered.  Risk Prescription drug management.          Final Clinical Impression(s) / ED Diagnoses Final diagnoses:  Nonintractable headache, unspecified chronicity pattern, unspecified headache type  Dehydration  Constipation, unspecified constipation type    Rx / DC Orders ED Discharge Orders          Ordered    polyethylene glycol powder (SM CLEARLAX) 17 GM/SCOOP powder  Daily        08/19/21 0855              Tommy Morel, DO 08/19/21 0953    Franne Forts, DO 08/26/21 [phone]

## 2021-08-28 ENCOUNTER — Emergency Department (HOSPITAL_BASED_OUTPATIENT_CLINIC_OR_DEPARTMENT_OTHER)
Admission: EM | Admit: 2021-08-28 | Discharge: 2021-08-28 | Disposition: A | Discharge status: Home / Self Care | Payer: Self-pay | Attending: Emergency Medicine | Admitting: Emergency Medicine

## 2021-08-28 ENCOUNTER — Encounter (HOSPITAL_BASED_OUTPATIENT_CLINIC_OR_DEPARTMENT_OTHER): Payer: Self-pay | Admitting: Emergency Medicine

## 2021-08-28 ENCOUNTER — Other Ambulatory Visit: Payer: Self-pay

## 2021-08-28 ENCOUNTER — Emergency Department (HOSPITAL_BASED_OUTPATIENT_CLINIC_OR_DEPARTMENT_OTHER): Payer: Self-pay

## 2021-08-28 DIAGNOSIS — Z20822 Contact with and (suspected) exposure to covid-19: Secondary | ICD-10-CM | POA: Insufficient documentation

## 2021-08-28 DIAGNOSIS — A084 Viral intestinal infection, unspecified: Secondary | ICD-10-CM | POA: Insufficient documentation

## 2021-08-28 DIAGNOSIS — R112 Nausea with vomiting, unspecified: Secondary | ICD-10-CM | POA: Insufficient documentation

## 2021-08-28 DIAGNOSIS — M545 Low back pain, unspecified: Secondary | ICD-10-CM | POA: Insufficient documentation

## 2021-08-28 DIAGNOSIS — K2970 Gastritis, unspecified, without bleeding: Secondary | ICD-10-CM

## 2021-08-28 LAB — URINALYSIS, ROUTINE W REFLEX MICROSCOPIC
Bilirubin Urine: NEGATIVE
Glucose, UA: NEGATIVE mg/dL
Ketones, ur: NEGATIVE mg/dL
Leukocytes,Ua: NEGATIVE
Nitrite: NEGATIVE
Protein, ur: NEGATIVE mg/dL
Specific Gravity, Urine: 1.02 (ref 1.005–1.030)
pH: 6 (ref 5.0–8.0)

## 2021-08-28 LAB — CBC
HCT: 39.4 % (ref 39.0–52.0)
Hemoglobin: 13.5 g/dL (ref 13.0–17.0)
MCH: 29.7 pg (ref 26.0–34.0)
MCHC: 34.3 g/dL (ref 30.0–36.0)
MCV: 86.8 fL (ref 80.0–100.0)
Platelets: 227 10*3/uL (ref 150–400)
RBC: 4.54 MIL/uL (ref 4.22–5.81)
RDW: 11 % — ABNORMAL LOW (ref 11.5–15.5)
WBC: 12.6 10*3/uL — ABNORMAL HIGH (ref 4.0–10.5)
nRBC: 0 % (ref 0.0–0.2)

## 2021-08-28 LAB — COMPREHENSIVE METABOLIC PANEL
ALT: 14 U/L (ref 0–44)
AST: 17 U/L (ref 15–41)
Albumin: 4.4 g/dL (ref 3.5–5.0)
Alkaline Phosphatase: 66 U/L (ref 38–126)
Anion gap: 7 (ref 5–15)
BUN: 10 mg/dL (ref 6–20)
CO2: 24 mmol/L (ref 22–32)
Calcium: 8.7 mg/dL — ABNORMAL LOW (ref 8.9–10.3)
Chloride: 103 mmol/L (ref 98–111)
Creatinine, Ser: 0.85 mg/dL (ref 0.61–1.24)
GFR, Estimated: >60 mL/min (ref >60)
Glucose, Bld: 119 mg/dL — ABNORMAL HIGH (ref 70–99)
Potassium: 3.5 mmol/L (ref 3.5–5.1)
Sodium: 134 mmol/L — ABNORMAL LOW (ref 135–145)
Total Bilirubin: 0.9 mg/dL (ref 0.3–1.2)
Total Protein: 8.1 g/dL (ref 6.5–8.1)

## 2021-08-28 LAB — URINALYSIS, MICROSCOPIC (REFLEX)

## 2021-08-28 LAB — LIPASE, BLOOD: Lipase: 26 U/L (ref 11–51)

## 2021-08-28 LAB — SARS CORONAVIRUS 2 BY RT PCR: SARS Coronavirus 2 by RT PCR: NEGATIVE

## 2021-08-28 MED ORDER — IOHEXOL 300 MG/ML  SOLN
80.0000 mL | Freq: Once | INTRAMUSCULAR | Status: AC | PRN
  Administered 2021-08-28: 80 mL via INTRAVENOUS

## 2021-08-28 NOTE — Discharge Instructions (Signed)
Work-up here today without any acute findings.  May be secondary to a viral illness.  But COVID was negative.  Return for any new or worse symptoms.  Would recommend disorder liquids today and then bland diet starting tomorrow.

## 2021-08-28 NOTE — ED Notes (Signed)
Pt states that he has thrown up twice since being triaged.

## 2021-08-28 NOTE — ED Provider Notes (Signed)
[facility] EMERGENCY DEPARTMENT Provider Note   CSN: [id number] Arrival date & time: 08/28/21  1112     History  Chief Complaint  Patient presents with   Back Pain    Tommy Henderson is a 31 y.o. male.  Patient with complaint bilateral lower back pain not made worse with movement and she feels better with movement decreased appetite chills nausea and vomiting today.  Other symptoms started yesterday.  Has low-grade fever here.  Daughter has upper respiratory infection at home but he does not have any cough sore throat or congestion.  No diarrhea.  Temp here 100.  Past medical history significant for Bell's palsy.       Home Medications Prior to Admission medications   Medication Sig Start Date End Date Taking? Authorizing Provider  polyethylene glycol powder (SM CLEARLAX) 17 GM/SCOOP powder Mix  17 grams in 8 ounces of liquid and drink by mouth daily. 08/19/21   Franne Forts, DO      Allergies    Patient has no known allergies.    Review of Systems   Review of Systems  Constitutional:  Positive for appetite change, chills and fever.  HENT:  Negative for ear pain and sore throat.   Eyes:  Negative for pain and visual disturbance.  Respiratory:  Negative for cough and shortness of breath.   Cardiovascular:  Negative for chest pain and palpitations.  Gastrointestinal:  Negative for abdominal pain and vomiting.  Genitourinary:  Negative for dysuria and hematuria.  Musculoskeletal:  Positive for back pain. Negative for arthralgias.  Skin:  Negative for color change and rash.  Neurological:  Negative for seizures and syncope.  All other systems reviewed and are negative.   Physical Exam Updated Vital Signs BP 128/82 (BP Location: Right Arm)   Pulse (!) 101   Temp 100 F (37.8 C) (Oral)   Resp 18   SpO2 99%  Physical Exam Vitals and nursing note reviewed.  Constitutional:      General: He is not in acute distress.    Appearance: Normal appearance. He is  well-developed.  HENT:     Head: Normocephalic and atraumatic.  Eyes:     Extraocular Movements: Extraocular movements intact.     Conjunctiva/sclera: Conjunctivae normal.     Pupils: Pupils are equal, round, and reactive to light.  Cardiovascular:     Rate and Rhythm: Normal rate and regular rhythm.     Heart sounds: No murmur heard. Pulmonary:     Effort: Pulmonary effort is normal. No respiratory distress.     Breath sounds: Normal breath sounds.  Abdominal:     General: There is no distension.     Palpations: Abdomen is soft.     Tenderness: There is no abdominal tenderness.  Musculoskeletal:        General: No swelling.     Cervical back: Normal range of motion and neck supple.  Skin:    General: Skin is warm and dry.     Capillary Refill: Capillary refill takes less than 2 seconds.  Neurological:     General: No focal deficit present.     Mental Status: He is alert and oriented to person, place, and time.     Cranial Nerves: No cranial nerve deficit.     Sensory: No sensory deficit.     Motor: No weakness.  Psychiatric:        Mood and Affect: Mood normal.     ED Results / Procedures / Treatments  Labs (all labs ordered are listed, but only abnormal results are displayed) Labs Reviewed  COMPREHENSIVE METABOLIC PANEL - Abnormal; Notable for the following components:      Result Value   Sodium 134 (*)    Glucose, Bld 119 (*)    Calcium 8.7 (*)    All other components within normal limits  CBC - Abnormal; Notable for the following components:   WBC 12.6 (*)    RDW 11.0 (*)    All other components within normal limits  URINALYSIS, ROUTINE W REFLEX MICROSCOPIC - Abnormal; Notable for the following components:   Hgb urine dipstick TRACE (*)    All other components within normal limits  URINALYSIS, MICROSCOPIC (REFLEX) - Abnormal; Notable for the following components:   Bacteria, UA FEW (*)    All other components within normal limits  SARS CORONAVIRUS 2 BY RT  PCR  LIPASE, BLOOD    EKG None  Radiology CT Abdomen Pelvis W Contrast  Result Date: 08/28/2021 CLINICAL DATA:  Nausea/vomiting Bilateral CVA pain EXAM: CT ABDOMEN AND PELVIS WITH CONTRAST TECHNIQUE: Multidetector CT imaging of the abdomen and pelvis was performed using the standard protocol following bolus administration of intravenous contrast. RADIATION DOSE REDUCTION: This exam was performed according to the departmental dose-optimization program which includes automated exposure control, adjustment of the mA and/or kV according to patient size and/or use of iterative reconstruction technique. CONTRAST:  16mL OMNIPAQUE IOHEXOL 300 MG/ML  SOLN COMPARISON:  None Available. FINDINGS: Lower chest: No acute airspace disease or pleural effusion. Heart is normal in size. Hepatobiliary: No focal liver abnormality is seen. No gallstones, gallbladder wall thickening, or biliary dilatation. Pancreas: No ductal dilatation or inflammation. Spleen: Normal in size without focal abnormality. Adrenals/Urinary Tract: Normal adrenal glands. No hydronephrosis. No renal calculi. Subcentimeter hypodensity in the upper right kidney is too small to characterize, likely small cyst. No dedicated follow-up is recommended. No perinephric edema. Partially distended urinary bladder, no bladder wall thickening. Stomach/Bowel: Minimal wall thickening at the gastroesophageal junction. The stomach is otherwise unremarkable. Occasional fluid-filled small bowel in the pelvis without wall thickening or inflammation. The appendix is upper normal in thickness at 6 mm, however no periappendiceal fat stranding or inflammatory change. No appendicolith. Majority of the colon is decompressed, no colonic inflammatory change. Vascular/Lymphatic: Normal caliber abdominal aorta. Patent portal, splenic, and mesenteric veins. No abdominopelvic adenopathy. Reproductive: Prostate is unremarkable. Other: No free air, free fluid, or intra-abdominal fluid  collection. Small fat containing umbilical hernia. Musculoskeletal: There are no acute or suspicious osseous abnormalities. IMPRESSION: 1. Minimal wall thickening at the gastroesophageal junction, can be seen with reflux or esophagitis. 2. Occasional fluid-filled small bowel in the pelvis without wall thickening or inflammation, suggesting enteritis. 3. Small fat containing umbilical hernia. Electronically Signed   By: Keith Rake M.D.   On: 08/28/2021 17:01   DG Chest Port 1 View  Result Date: 08/28/2021 CLINICAL DATA:  Fever and vomiting. EXAM: PORTABLE CHEST 1 VIEW COMPARISON:  None Available. FINDINGS: The heart size and mediastinal contours are within normal limits. Both lungs are clear. The visualized skeletal structures are unremarkable. IMPRESSION: No active disease. Electronically Signed   By: San Morelle M.D.   On: 08/28/2021 16:12    Procedures Procedures    Medications Ordered in ED Medications  iohexol (OMNIPAQUE) 300 MG/ML solution 80 mL (80 mLs Intravenous Contrast Given 08/28/21 1630)    ED Course/ Medical Decision Making/ A&P  Medical Decision Making Amount and/or Complexity of Data Reviewed Labs: ordered. Radiology: ordered.  Risk Prescription drug management.  Work-up here today without any significant findings.  Urinalysis negative lipase normal complete metabolic panel normal other than a sodium of 134 no liver function test abnormalities.  Renal function is normal.  Mild leukocytosis with a white count of 12.6 hemoglobin 13.5.  COVID testing negative.  Patient's symptoms are somewhat vague.  But CT scan negative except for maybe some mild enteritis.  Which could go along with a viral illness.  Patient did have some nausea and vomiting earlier today.  No longer feels nauseated.  Chest x-ray also negative for any acute findings.  We will just treat patient symptomatically.  Patient does not need a work note.  Patient nontoxic no  acute distress.  Final Clinical Impression(s) / ED Diagnoses Final diagnoses:  Acute bilateral low back pain without sciatica  Viral gastritis    Rx / DC Orders ED Discharge Orders     None         Vanetta Mulders, MD 08/28/21 1731

## 2021-08-28 NOTE — ED Triage Notes (Signed)
Pt reports chills, lower midline back back aches, and decreased appetite since yesterday. Feels similar to when he came last week and was given stool softener.

## 2021-10-31 ENCOUNTER — Emergency Department (HOSPITAL_BASED_OUTPATIENT_CLINIC_OR_DEPARTMENT_OTHER)
Admission: EM | Admit: 2021-10-31 | Discharge: 2021-10-31 | Disposition: A | Discharge status: Home / Self Care | Payer: Self-pay | Attending: Emergency Medicine | Admitting: Emergency Medicine

## 2021-10-31 ENCOUNTER — Encounter (HOSPITAL_BASED_OUTPATIENT_CLINIC_OR_DEPARTMENT_OTHER): Payer: Self-pay | Admitting: Emergency Medicine

## 2021-10-31 ENCOUNTER — Other Ambulatory Visit: Payer: Self-pay

## 2021-10-31 DIAGNOSIS — J020 Streptococcal pharyngitis: Secondary | ICD-10-CM | POA: Insufficient documentation

## 2021-10-31 MED ORDER — DEXAMETHASONE 4 MG PO TABS
10.0000 mg | ORAL_TABLET | Freq: Once | ORAL | Status: AC
  Administered 2021-10-31: 10 mg via ORAL
  Filled 2021-10-31: qty 3

## 2021-10-31 MED ORDER — PENICILLIN G BENZATHINE 1200000 UNIT/2ML IM SUSY
1.2000 10*6.[IU] | PREFILLED_SYRINGE | Freq: Once | INTRAMUSCULAR | Status: AC
  Administered 2021-10-31: 1.2 10*6.[IU] via INTRAMUSCULAR
  Filled 2021-10-31: qty 2

## 2021-10-31 NOTE — ED Triage Notes (Signed)
Patient presents to ED via POV from home. Here with sore throat that began last night. Expresses concerns for strep throat.

## 2021-10-31 NOTE — ED Provider Notes (Signed)
[facility] EMERGENCY DEPARTMENT Provider Note   CSN: [id number] Arrival date & time: 10/31/21  1058     History  Chief Complaint  Patient presents with   Sore Throat    Tommy Henderson is a 31 y.o. male.  31 yo M with a chief complaint of a sore throat.  This been going on for about 24 hours.  He was exposed to someone else that was sick.  He tells me that she did not tell him until after their interaction.  He denies cough denies fever.   Sore Throat       Home Medications Prior to Admission medications   Medication Sig Start Date End Date Taking? Authorizing Provider  polyethylene glycol powder (SM CLEARLAX) 17 GM/SCOOP powder Mix  17 grams in 8 ounces of liquid and drink by mouth daily. 08/19/21   Franne Forts, DO      Allergies    Patient has no known allergies.    Review of Systems   Review of Systems  Physical Exam Updated Vital Signs BP 132/76   Pulse (!) 109   Temp 97.7 F (36.5 C) (Oral)   Resp 17   SpO2 98%  Physical Exam Vitals and nursing note reviewed.  Constitutional:      Appearance: He is well-developed.  HENT:     Head: Normocephalic and atraumatic.     Mouth/Throat:     Tonsils: Tonsillar exudate present. 2+ on the right. 0 on the left.     Comments: Right-sided tonsillar swelling with exudates.  Uvula is midline.  Tolerating secretions without difficulty.  Able to rotate his head 45 degrees in either direction without pain.  No sublingual swelling. Eyes:     Pupils: Pupils are equal, round, and reactive to light.  Neck:     Vascular: No JVD.  Cardiovascular:     Rate and Rhythm: Normal rate and regular rhythm.     Heart sounds: No murmur heard.    No friction rub. No gallop.  Pulmonary:     Effort: No respiratory distress.     Breath sounds: No wheezing.  Abdominal:     General: There is no distension.     Tenderness: There is no abdominal tenderness. There is no guarding or rebound.  Musculoskeletal:        General:  Normal range of motion.     Cervical back: Normal range of motion and neck supple.  Skin:    Coloration: Skin is not pale.     Findings: No rash.  Neurological:     Mental Status: He is alert and oriented to person, place, and time.  Psychiatric:        Behavior: Behavior normal.     ED Results / Procedures / Treatments   Labs (all labs ordered are listed, but only abnormal results are displayed) Labs Reviewed - No data to display  EKG None  Radiology No results found.  Procedures Procedures    Medications Ordered in ED Medications  penicillin g benzathine (BICILLIN LA) 1200000 UNIT/2ML injection 1.2 Million Units (has no administration in time range)  dexamethasone (DECADRON) tablet 10 mg (has no administration in time range)    ED Course/ Medical Decision Making/ A&P                           Medical Decision Making Risk Prescription drug management.   31 yo M with a chief complaint of a sore  throat.  Going on for about 24 hours.  Clinically the patient is strep pharyngitis with unilateral tonsillar swelling and exudates and left anterior cervical lymphadenopathy.  We will treat him presumptively with antibiotics.  We will give him a dose of Bicillin here.  Decadron.  PCP follow-up.  11:20 AM:  I have discussed the diagnosis/risks/treatment options with the patient.  Evaluation and diagnostic testing in the emergency department does not suggest an emergent condition requiring admission or immediate intervention beyond what has been performed at this time.  They will follow up with PCP. We also discussed returning to the ED immediately if new or worsening sx occur. We discussed the sx which are most concerning (e.g., sudden worsening pain, fever, inability to tolerate by mouth) that necessitate immediate return. Medications administered to the patient during their visit and any new prescriptions provided to the patient are listed below.  Medications given during this  visit Medications  penicillin g benzathine (BICILLIN LA) 1200000 UNIT/2ML injection 1.2 Million Units (has no administration in time range)  dexamethasone (DECADRON) tablet 10 mg (has no administration in time range)     The patient appears reasonably screen and/or stabilized for discharge and I doubt any other medical condition or other [facility] requiring further screening, evaluation, or treatment in the ED at this time prior to discharge.          Final Clinical Impression(s) / ED Diagnoses Final diagnoses:  Strep pharyngitis    Rx / DC Orders ED Discharge Orders     None         Deno Etienne, DO 10/31/21 1120

## 2021-10-31 NOTE — Discharge Instructions (Signed)
Take tylenol 2 pills 4 times a day and motrin 4 pills 3 times a day.  Drink plenty of fluids.  Return for worsening pain or inability to swallow.  Follow up with your family doctor.

## 2022-02-16 ENCOUNTER — Other Ambulatory Visit: Payer: Self-pay

## 2022-02-16 ENCOUNTER — Encounter (HOSPITAL_BASED_OUTPATIENT_CLINIC_OR_DEPARTMENT_OTHER): Payer: Self-pay | Admitting: Urology

## 2022-02-16 ENCOUNTER — Emergency Department (HOSPITAL_BASED_OUTPATIENT_CLINIC_OR_DEPARTMENT_OTHER)
Admission: EM | Admit: 2022-02-16 | Discharge: 2022-02-16 | Disposition: A | Discharge status: Home / Self Care | Payer: Self-pay | Attending: Emergency Medicine | Admitting: Emergency Medicine

## 2022-02-16 DIAGNOSIS — J029 Acute pharyngitis, unspecified: Secondary | ICD-10-CM | POA: Insufficient documentation

## 2022-02-16 DIAGNOSIS — Z1152 Encounter for screening for COVID-19: Secondary | ICD-10-CM | POA: Insufficient documentation

## 2022-02-16 LAB — GROUP A STREP BY PCR: Group A Strep by PCR: NOT DETECTED

## 2022-02-16 LAB — RESP PANEL BY RT-PCR (RSV, FLU A&B, COVID)  RVPGX2
Influenza A by PCR: NEGATIVE
Influenza B by PCR: NEGATIVE
Resp Syncytial Virus by PCR: NEGATIVE
SARS Coronavirus 2 by RT PCR: NEGATIVE

## 2022-02-16 NOTE — ED Provider Notes (Signed)
  [place] [facility] EMERGENCY DEPARTMENT Provider Note   CSN: [id number] Arrival date & time: 02/16/22  2210     History  Chief Complaint  Patient presents with   Sore Throat    Tommy Henderson is a 32 y.o. male.  The history is provided by the patient.  Sore Throat This is a new problem. The current episode started more than 2 days ago. The problem occurs daily. The problem has been gradually worsening. The symptoms are aggravated by swallowing. Nothing relieves the symptoms.  Patient thinks he had strep pharyngitis He is able to take fluids     Home Medications Prior to Admission medications   Medication Sig Start Date End Date Taking? Authorizing Provider  polyethylene glycol powder (SM CLEARLAX) 17 GM/SCOOP powder Mix  17 grams in 8 ounces of liquid and drink by mouth daily. 4/0/98   Lianne Cure, DO      Allergies    Patient has no known allergies.    Review of Systems   Review of Systems  Constitutional:  Negative for fever.  Gastrointestinal:  Negative for vomiting.    Physical Exam Updated Vital Signs BP (!) 141/90 (BP Location: Right Arm)   Pulse 72   Temp 97.7 F (36.5 C)   Resp 14   Ht 1.702 m (5\' 7" )   Wt 69.4 kg   SpO2 97%   BMI 23.96 kg/m  Physical Exam Constitutional: well developed, well nourished, no distress Head: normocephalic/atraumatic Eyes: EOMI/PERRL ENMT: mucous membranes moist,  uvula midline without exudates, minimal erythema, no stridor, no drooling Neck: supple, no meningeal signs Neuro: awake/alert, no distress, appropriate for age, [id number], no facial droop is noted, no lethargy is noted Skin:  Color normal.  Warm Psych: appropriate for age, awake/alert and appropriate  ED Results / Procedures / Treatments   Labs (all labs ordered are listed, but only abnormal results are displayed) Labs Reviewed  GROUP A STREP BY PCR  RESP PANEL BY RT-PCR (RSV, FLU A&B, COVID)  [id number]    EKG None  Radiology No results  found.  Procedures Procedures    Medications Ordered in ED Medications - No data to display  ED Course/ Medical Decision Making/ A&P                           Medical Decision Making  Patient with likely viral pharyngitis.  He is in no acute distress.  He will be discharged home        Final Clinical Impression(s) / ED Diagnoses Final diagnoses:  Pharyngitis, unspecified etiology    Rx / DC Orders ED Discharge Orders     None         Ripley Fraise, MD 02/16/22 2350

## 2022-02-16 NOTE — ED Triage Notes (Signed)
Pt states sore throat x 3-4 days Denies fever

## 2022-04-19 ENCOUNTER — Encounter (HOSPITAL_BASED_OUTPATIENT_CLINIC_OR_DEPARTMENT_OTHER): Payer: Self-pay | Admitting: Emergency Medicine

## 2022-04-19 ENCOUNTER — Other Ambulatory Visit: Payer: Self-pay

## 2022-04-19 ENCOUNTER — Emergency Department (HOSPITAL_BASED_OUTPATIENT_CLINIC_OR_DEPARTMENT_OTHER)
Admission: EM | Admit: 2022-04-19 | Discharge: 2022-04-19 | Disposition: A | Discharge status: Home / Self Care | Payer: BLUE CROSS/BLUE SHIELD | Attending: Emergency Medicine | Admitting: Emergency Medicine

## 2022-04-19 DIAGNOSIS — L03312 Cellulitis of back [any part except buttock]: Secondary | ICD-10-CM | POA: Insufficient documentation

## 2022-04-19 DIAGNOSIS — M549 Dorsalgia, unspecified: Secondary | ICD-10-CM | POA: Diagnosis present

## 2022-04-19 MED ORDER — DOXYCYCLINE HYCLATE 100 MG PO CAPS: 100.0000 mg | ORAL_CAPSULE | Freq: Two times a day (BID) | ORAL | 0 refills | Status: DC

## 2022-04-19 MED ORDER — DOXYCYCLINE HYCLATE 100 MG PO CAPS: 100.0000 mg | ORAL_CAPSULE | Freq: Two times a day (BID) | ORAL | 0 refills | Status: AC

## 2022-04-19 NOTE — ED Triage Notes (Signed)
Has a small bump on rt back and he mashed it x 2 dayys , raea is slightly red

## 2022-04-19 NOTE — ED Provider Notes (Signed)
[place] [facility] Provider Note   CSN: [id number] Arrival date & time: 04/19/22  1810     History  Chief Complaint  Patient presents with   Abscess    Tommy Henderson is a 32 y.o. male.  He denies any chronic medical conditions.  Presents the ER complaining of a bump to the left posterior chest wall for the past 2 days.  He states he tried to squeeze it but nothing came out and it is now more red and swollen and painful.  Denies fevers or chills, no trouble swallowing or breathing.  HPI     Home Medications Prior to Admission medications   Medication Sig Start Date End Date Taking? Authorizing Provider  doxycycline (VIBRAMYCIN) 100 MG capsule Take 1 capsule (100 mg total) by mouth 2 (two) times daily for 7 days. 04/19/22 04/26/22  Sherrye Payor A, PA-C  polyethylene glycol powder (SM CLEARLAX) 17 GM/SCOOP powder Mix  17 grams in 8 ounces of liquid and drink by mouth daily. [id number]   Lianne Cure, DO      Allergies    Patient has no known allergies.    Review of Systems   Review of Systems  Physical Exam Updated Vital Signs BP (!) 148/99 (BP Location: Right Arm)   Pulse 79   Temp 99 F (37.2 C) (Oral)   Resp 18   Ht '5\' 7"'$  (1.702 m)   Wt 69.4 kg   SpO2 100%   BMI 23.96 kg/m  Physical Exam Vitals and nursing note reviewed.  Constitutional:      General: He is not in acute distress.    Appearance: He is well-developed.  HENT:     Head: Normocephalic and atraumatic.  Eyes:     Conjunctiva/sclera: Conjunctivae normal.  Cardiovascular:     Rate and Rhythm: Normal rate and regular rhythm.     Heart sounds: No murmur heard. Pulmonary:     Effort: Pulmonary effort is normal. No respiratory distress.     Breath sounds: Normal breath sounds.  Abdominal:     Palpations: Abdomen is soft.     Tenderness: There is no abdominal tenderness.  Musculoskeletal:        General: No swelling.     Cervical back: Neck supple.  Skin:     General: Skin is warm and dry.     Capillary Refill: Capillary refill takes less than 2 seconds.     Comments: Small raised area with central punctum however patient had squeezed it with about 3 to 4 cm of surrounding erythema.No crepitus. No fluctuance  Neurological:     Mental Status: He is alert.  Psychiatric:        Mood and Affect: Mood normal.     ED Results / Procedures / Treatments   Labs (all labs ordered are listed, but only abnormal results are displayed) Labs Reviewed - No data to display  EKG None  Radiology No results found.  Procedures Procedures    Medications Ordered in ED Medications - No data to display  ED Course/ Medical Decision Making/ A&P                             Medical Decision Making Ddx: cellulitis, abscess, sebaceous cyst, lipoma, other ED course: Had a bump on his back 2 days ago that he tried to squeeze it, nothing came out but is now more painful, red and slightly swollen.  Denies fevers or chills.  On exam he has a small raised area with no drainage or fluctuance.  There is minimal central induration with about 3 to 4 cm of surrounding erythema.  No red streaking.  Likely cellulitis.  No indication that I&D is indicated at this time.  He is given antibiotics.  Rest.  Use warm compresses several times a day, follow-up close with his primary care doctor, come back to the ER for new or worsening symptoms.           Final Clinical Impression(s) / ED Diagnoses Final diagnoses:  Cellulitis of mid back region    Rx / DC Orders ED Discharge Orders          Ordered    doxycycline (VIBRAMYCIN) 100 MG capsule  2 times daily,   Status:  Discontinued        04/19/22 1835    doxycycline (VIBRAMYCIN) 100 MG capsule  2 times daily        04/19/22 1838              Gwenevere Abbot, PA-C 04/19/22 Jet, [place], DO 04/19/22 [phone]

## 2022-04-19 NOTE — Discharge Instructions (Addendum)
You are being put on antibiotics for the infected area on your back.  Use warm compresses, keep it clean and dry, come back if you develop fevers, chills, increased pain or enlarging fluid collection.

## 2022-12-25 ENCOUNTER — Other Ambulatory Visit: Payer: Self-pay

## 2022-12-25 ENCOUNTER — Emergency Department (HOSPITAL_BASED_OUTPATIENT_CLINIC_OR_DEPARTMENT_OTHER): Payer: BLUE CROSS/BLUE SHIELD

## 2022-12-25 ENCOUNTER — Encounter (HOSPITAL_BASED_OUTPATIENT_CLINIC_OR_DEPARTMENT_OTHER): Payer: Self-pay

## 2022-12-25 DIAGNOSIS — L03011 Cellulitis of right finger: Secondary | ICD-10-CM | POA: Diagnosis present

## 2022-12-25 NOTE — ED Triage Notes (Signed)
Pt reports he smashed his right ring finger two weeks ago. He reports he is still having pain in that finger and its not getting any better. Dry, scabbed over wound noted next to his fingernail.

## 2022-12-26 ENCOUNTER — Emergency Department (HOSPITAL_BASED_OUTPATIENT_CLINIC_OR_DEPARTMENT_OTHER): Payer: BLUE CROSS/BLUE SHIELD

## 2022-12-26 ENCOUNTER — Emergency Department (HOSPITAL_BASED_OUTPATIENT_CLINIC_OR_DEPARTMENT_OTHER)
Admission: EM | Admit: 2022-12-26 | Discharge: 2022-12-26 | Disposition: A | Discharge status: Home / Self Care | Payer: BLUE CROSS/BLUE SHIELD | Attending: Emergency Medicine | Admitting: Emergency Medicine

## 2022-12-26 DIAGNOSIS — L03011 Cellulitis of right finger: Secondary | ICD-10-CM

## 2022-12-26 MED ORDER — CEPHALEXIN 500 MG PO CAPS: 500.0000 mg | ORAL_CAPSULE | Freq: Two times a day (BID) | ORAL | 0 refills | Status: AC

## 2022-12-26 MED ORDER — CEPHALEXIN 250 MG PO CAPS
500.0000 mg | ORAL_CAPSULE | Freq: Once | ORAL | Status: AC
  Administered 2022-12-26: 500 mg via ORAL
  Filled 2022-12-26: qty 2

## 2022-12-26 NOTE — ED Provider Notes (Signed)
[place] [facility]  Provider Note  CSN: [id number] Arrival date & time: 12/25/22 2319  History Chief Complaint  Patient presents with   Finger Injury    Tommy Henderson is a 32 y.o. male reports he 'smashed' his R ring finger two weeks ago, although he can't remember exactly what happened. He has had persistent pain since then. Also noticed some swelling and dry/scabbed skin over the medial nail margin. No drainage.    Home Medications Prior to Admission medications   Medication Sig Start Date End Date Taking? Authorizing Provider  cephALEXin (KEFLEX) 500 MG capsule Take 1 capsule (500 mg total) by mouth 2 (two) times daily for 7 days. 12/26/22 01/02/23 Yes Pollyann Savoy, MD  polyethylene glycol powder (SM CLEARLAX) 17 GM/SCOOP powder Mix  17 grams in 8 ounces of liquid and drink by mouth daily. 08/19/21   Franne Forts, DO     Allergies    Patient has no known allergies.   Review of Systems   Review of Systems Please see HPI for pertinent positives and negatives  Physical Exam BP 121/86 (BP Location: Left Arm)   Pulse 90   Temp 98 F (36.7 C)   Resp 18   Ht 5\' 7"  (1.702 m)   Wt 73.9 kg   SpO2 95%   BMI 25.53 kg/m   Physical Exam Vitals and nursing note reviewed.  HENT:     Head: Normocephalic.     Nose: Nose normal.  Eyes:     Extraocular Movements: Extraocular movements intact.  Pulmonary:     Effort: Pulmonary effort is normal.  Musculoskeletal:        General: Tenderness (distal R ring finger) present. Normal range of motion.     Cervical back: Neck supple.     Comments: Mild paronychia at the R medial nail margin  Skin:    Findings: No rash (on exposed skin).  Neurological:     Mental Status: He is alert and oriented to person, place, and time.  Psychiatric:        Mood and Affect: Mood normal.     ED Results / Procedures / Treatments   EKG None  Procedures Procedures  Medications Ordered in the  ED Medications  cephALEXin (KEFLEX) capsule 500 mg (has no administration in time range)    Initial Impression and Plan  Patient with recent finger injury, has continued pain. Exam is consistent with a paronychia, but will check xray as well to rule out fracture.   ED Course   Clinical Course as of 12/26/22 0220  Tue Dec 26, 2022  0218 I personally viewed the images from radiology studies and awaiting radiologist interpretation: Xray neg for fracture. Will treat for paronychia.  [CS]    Clinical Course User Index [CS] Pollyann Savoy, MD     MDM Rules/Calculators/A&P Medical Decision Making Problems Addressed: Paronychia, finger, right: acute illness or injury  Amount and/or Complexity of Data Reviewed Radiology: ordered and independent interpretation performed. Decision-making details documented in ED Course.  Risk Prescription drug management.     Final Clinical Impression(s) / ED Diagnoses Final diagnoses:  Paronychia, finger, right    Rx / DC Orders ED Discharge Orders          Ordered    cephALEXin (KEFLEX) 500 MG capsule  2 times daily        12/26/22 0219             Susy Frizzle  B, MD 12/26/22 [id number]

## 2023-07-04 ENCOUNTER — Encounter (HOSPITAL_BASED_OUTPATIENT_CLINIC_OR_DEPARTMENT_OTHER): Payer: Self-pay | Admitting: Emergency Medicine

## 2023-07-04 ENCOUNTER — Emergency Department (HOSPITAL_BASED_OUTPATIENT_CLINIC_OR_DEPARTMENT_OTHER): Payer: Self-pay

## 2023-07-04 ENCOUNTER — Emergency Department (HOSPITAL_BASED_OUTPATIENT_CLINIC_OR_DEPARTMENT_OTHER)
Admission: EM | Admit: 2023-07-04 | Discharge: 2023-07-04 | Disposition: A | Discharge status: Home / Self Care | Payer: Self-pay

## 2023-07-04 ENCOUNTER — Other Ambulatory Visit: Payer: Self-pay

## 2023-07-04 DIAGNOSIS — I10 Essential (primary) hypertension: Secondary | ICD-10-CM | POA: Insufficient documentation

## 2023-07-04 DIAGNOSIS — R0602 Shortness of breath: Secondary | ICD-10-CM | POA: Insufficient documentation

## 2023-07-04 DIAGNOSIS — Z79899 Other long term (current) drug therapy: Secondary | ICD-10-CM | POA: Insufficient documentation

## 2023-07-04 LAB — BASIC METABOLIC PANEL WITH GFR
Anion gap: 11 (ref 5–15)
BUN: 14 mg/dL (ref 6–20)
CO2: 26 mmol/L (ref 22–32)
Calcium: 9.1 mg/dL (ref 8.9–10.3)
Chloride: 102 mmol/L (ref 98–111)
Creatinine, Ser: 0.87 mg/dL (ref 0.61–1.24)
GFR, Estimated: >60 mL/min (ref >60)
Glucose, Bld: 90 mg/dL (ref 70–99)
Potassium: 3.7 mmol/L (ref 3.5–5.1)
Sodium: 139 mmol/L (ref 135–145)

## 2023-07-04 LAB — CBC
HCT: 41.6 % (ref 39.0–52.0)
Hemoglobin: 13.9 g/dL (ref 13.0–17.0)
MCH: 29.4 pg (ref 26.0–34.0)
MCHC: 33.4 g/dL (ref 30.0–36.0)
MCV: 88.1 fL (ref 80.0–100.0)
Platelets: 242 10*3/uL (ref 150–400)
RBC: 4.72 MIL/uL (ref 4.22–5.81)
RDW: 11.3 % — ABNORMAL LOW (ref 11.5–15.5)
WBC: 5.1 10*3/uL (ref 4.0–10.5)
nRBC: 0 % (ref 0.0–0.2)

## 2023-07-04 LAB — TROPONIN T, HIGH SENSITIVITY: Troponin T High Sensitivity: <15 ng/L (ref <19)

## 2023-07-04 MED ORDER — HYDROCHLOROTHIAZIDE 25 MG PO TABS
25.0000 mg | ORAL_TABLET | Freq: Once | ORAL | Status: AC
  Administered 2023-07-04: 25 mg via ORAL
  Filled 2023-07-04: qty 1

## 2023-07-04 MED ORDER — HYDROCHLOROTHIAZIDE 25 MG PO TABS: 25.0000 mg | ORAL_TABLET | Freq: Every day | ORAL | 0 refills | Status: AC

## 2023-07-04 NOTE — ED Provider Notes (Signed)
 [facility] Provider Note   CSN: [id number] Arrival date & time: 07/04/23  4098     History  Chief Complaint  Patient presents with   Shortness of Breath    Tommy Henderson is a 33 y.o. male.  HPI Patient is a 33 year old male presents the ED today with complaints of shortness of breath that started today when he woke up, accompanied with mild chest pressure.  Previous medical history of NSTEMI likely secondary to either coronary vasospasms or myocarditis.  Notes that he is also wanting to cut back on his drinking, drinking on the weekends approximately 2 bottles of liquor every weekend for the last 2 years.  Also notes that he has had an increase in stress levels recently and has been a little anxious.  Notes that he ran the track yesterday without difficulty.  Also reports that he has had an elevated blood pressure for approximately 6 months and has not been prescribed any BP medications, wishing to be prescribed them currently.  Denies fever, visual changes, chest pain, abdominal pain, nausea, vomiting, diarrhea, hematochezia, melena, dysuria, hematuria, lower leg swelling.    Home Medications Prior to Admission medications   Medication Sig Start Date End Date Taking? Authorizing Provider  hydrochlorothiazide (HYDRODIURIL) 25 MG tablet Take 1 tablet (25 mg total) by mouth daily. 07/04/23  Yes Hayes Lipps, PA-C  polyethylene glycol powder (SM CLEARLAX) 17 GM/SCOOP powder Mix  17 grams in 8 ounces of liquid and drink by mouth daily. 08/19/21   Quinn Bucco, DO      Allergies    Patient has no known allergies.    Review of Systems   Review of Systems  Respiratory:  Positive for shortness of breath.   All other systems reviewed and are negative.   Physical Exam Updated Vital Signs BP (!) 138/96   Pulse 69   Temp 98.8 F (37.1 C) (Oral)   Resp 14   Wt 77.1 kg   SpO2 100%   BMI 26.63 kg/m  Physical Exam Vitals and nursing  note reviewed.  Constitutional:      General: He is not in acute distress.    Appearance: Normal appearance. He is not ill-appearing or diaphoretic.  HENT:     Head: Normocephalic and atraumatic.  Eyes:     General: No scleral icterus.       Right eye: No discharge.        Left eye: No discharge.     Extraocular Movements: Extraocular movements intact.     Conjunctiva/sclera: Conjunctivae normal.  Cardiovascular:     Rate and Rhythm: Normal rate and regular rhythm.     Pulses: Normal pulses.     Heart sounds: Normal heart sounds. No murmur heard.    No friction rub. No gallop.  Pulmonary:     Effort: Pulmonary effort is normal. No respiratory distress.     Breath sounds: Normal breath sounds. No stridor. No wheezing, rhonchi or rales.  Chest:     Chest wall: No tenderness.  Abdominal:     General: Abdomen is flat. There is no distension.     Palpations: Abdomen is soft.     Tenderness: There is no abdominal tenderness. There is no right CVA tenderness, left CVA tenderness or guarding.  Musculoskeletal:        General: No tenderness or deformity.     Right lower leg: No edema.     Left lower leg: No edema.  Skin:    General: Skin is warm.     Findings: No bruising or erythema.  Neurological:     General: No focal deficit present.     Mental Status: He is alert and oriented to person, place, and time. Mental status is at baseline.     Cranial Nerves: No cranial nerve deficit.     Sensory: No sensory deficit.     Motor: No weakness.  Psychiatric:        Mood and Affect: Mood normal.     ED Results / Procedures / Treatments   Labs (all labs ordered are listed, but only abnormal results are displayed) Labs Reviewed  CBC - Abnormal; Notable for the following components:      Result Value   RDW 11.3 (*)    All other components within normal limits  BASIC METABOLIC PANEL WITH GFR  TROPONIN T, HIGH SENSITIVITY  TROPONIN T, HIGH SENSITIVITY    EKG EKG  Interpretation Date/Time:  Wednesday Jul 04 2023 09:13:55 EDT Ventricular Rate:  72 PR Interval:  150 QRS Duration:  81 QT Interval:  378 QTC Calculation: 414 R Axis:   62  Text Interpretation: Sinus rhythm ST elev, probable normal early repol pattern Confirmed by Abner Hoffman [phone]) on 07/04/2023 9:18:18 AM  Radiology DG Chest 2 View Result Date: 07/04/2023 CLINICAL DATA:  Shortness of breath. EXAM: CHEST - 2 VIEW COMPARISON:  August 28, 2021. FINDINGS: The heart size and mediastinal contours are within normal limits. Both lungs are clear. The visualized skeletal structures are unremarkable. IMPRESSION: No active cardiopulmonary disease. Electronically Signed   By: Rosalene Colon M.D.   On: 07/04/2023 10:58    Procedures Procedures    Medications Ordered in ED Medications  hydrochlorothiazide (HYDRODIURIL) tablet 25 mg (25 mg Oral Given 07/04/23 1051)    ED Course/ Medical Decision Making/ A&P             HEART Score: 2                    Medical Decision Making Amount and/or Complexity of Data Reviewed Labs: ordered. Radiology: ordered.  Risk Prescription drug management.   This patient is a 33 year old male who presents to the ED for concern of shortness of breath that started today while he was getting his kids ready for school.  This was accompanied with mild chest pressure, dissimilar to his previous NSTEMI.  Reports being more anxious and stressed recently.  Currently asymptomatic.  On physical exam, patient is in no acute distress, afebrile, alert and orient x 4, speaking in full sentences, nontachypneic, nontachycardic. LCTAB, no murmurs, no abdominal tenderness to palpation, no CVA tenderness, no lower leg swelling.  Unremarkable physical exam  Suspect likely anxiety.  As no signs or symptoms of infection, ACS, PE, asthma or inflammatory process present at this time.  However will obtain labs as patient has appears medical history of atypical NSTEMI and has had high  blood pressure x 6 months without control.  Labs and imaging were all unremarkable.  With patient blood pressure continuing to rise, provided HCTZ and blood pressure on reevaluation had improved.  Will prescribe this for him in the outpatient setting as well as have him follow-up with PCP for further evaluation.  Patient states that he is currently asymptomatic otherwise.  Patient vital signs have remained stable throughout the course of patient's time in the ED. Low suspicion for any other emergent pathology at this time. I believe this patient  is safe to be discharged. Provided strict return to ER precautions. Patient expressed agreement and understanding of plan. All questions were answered.  Differential diagnoses prior to evaluation: The emergent differential diagnosis includes, but is not limited to, pneumonia, ACS, asthma, PE, pneumothorax, anxiety, heart failure, AAS. This is not an exhaustive differential.   Past Medical History / Co-morbidities / Social History: NSTEMI, Bell's palsy  Additional history: Chart reviewed. Pertinent results include:   Noted to have been admitted on 08/30/2021 for NSTEMI likely secondary to myocarditis versus coronary spasm. Last echo done on 09/17/2021 which showed EF of 55% with normal left ventricular function.  Lab Tests/Imaging studies: I personally interpreted labs/imaging and the pertinent results include:   CBC unremarkable Troponin unremarkable BMP unremarkable Chest x-ray not markable  I agree with the radiologist interpretation.  Cardiac monitoring: EKG obtained and interpreted by myself and attending physician which shows: sinus rhythm with ST elevation, possible early repol  EKG Interpretation Date/Time:  Wednesday Jul 04 2023 09:13:55 EDT Ventricular Rate:  72 PR Interval:  150 QRS Duration:  81 QT Interval:  378 QTC Calculation: 414 R Axis:   62  Text Interpretation: Sinus rhythm ST elev, probable normal early repol pattern  Confirmed by Abner Hoffman [phone]) on 07/04/2023 9:18:18 AM          Medications: I ordered medication including HCTZ.  I have reviewed the patients home medicines and have made adjustments as needed.  Social Determinants of Health: Has good followup with PCP  Disposition: After consideration of the diagnostic results and the patients response to treatment, I feel that the patient would benefit from discharge and treatment as above.   emergency department workup does not suggest an emergent condition requiring admission or immediate intervention beyond what has been performed at this time. The plan is: hydrochlorothiazide for BP control, followup with PCP, return for any new or worsening symptoms. The patient is safe for discharge and has been instructed to return immediately for worsening symptoms, change in symptoms or any other concerns.   Final Clinical Impression(s) / ED Diagnoses Final diagnoses:  Hypertension, unspecified type  Shortness of breath    Rx / DC Orders ED Discharge Orders          Ordered    hydrochlorothiazide (HYDRODIURIL) 25 MG tablet  Daily        07/04/23 1121              Hayes Lipps, PA-C 07/04/23 1124    Carin Charleston, MD 07/04/23 1352

## 2023-07-04 NOTE — ED Triage Notes (Signed)
 Woke up with shortness of breath today , no cough , no chest pain . Hx HTN x 6 months yet no meds .

## 2023-07-04 NOTE — Discharge Instructions (Addendum)
 You were seen today for high blood pressure and shortness of breath.  Your labs and imaging and physical exam were all reassuring that I have low suspicion for any emergent causes for your symptoms today.  I have started you on hydrochlorothiazide which you are to take once a day.  Be sure to follow-up with your primary care to have this represcribed for you as well as to continue to take log of your blood pressures so that you will be able to provide this to them when they recheck you and decide on further treatment.  Return to the ED if you can have any new or worsening symptoms including fever, vision changes, chest pain, shortness of breath

## 2023-07-23 ENCOUNTER — Telehealth: Payer: Self-pay

## 2023-07-31 ENCOUNTER — Telehealth: Payer: Self-pay

## 2023-08-03 ENCOUNTER — Ambulatory Visit: Payer: Self-pay | Admitting: Family Medicine

## 2023-11-25 ENCOUNTER — Other Ambulatory Visit: Payer: Self-pay

## 2023-11-25 ENCOUNTER — Encounter (HOSPITAL_BASED_OUTPATIENT_CLINIC_OR_DEPARTMENT_OTHER): Payer: Self-pay

## 2023-11-25 ENCOUNTER — Emergency Department (HOSPITAL_BASED_OUTPATIENT_CLINIC_OR_DEPARTMENT_OTHER)
Admission: EM | Admit: 2023-11-25 | Discharge: 2023-11-25 | Disposition: A | Discharge status: Home / Self Care | Payer: Self-pay | Attending: Emergency Medicine | Admitting: Emergency Medicine

## 2023-11-25 DIAGNOSIS — R310 Gross hematuria: Secondary | ICD-10-CM | POA: Insufficient documentation

## 2023-11-25 DIAGNOSIS — R10A1 Flank pain, right side: Secondary | ICD-10-CM | POA: Insufficient documentation

## 2023-11-25 LAB — URINALYSIS, ROUTINE W REFLEX MICROSCOPIC
Bilirubin Urine: NEGATIVE
Glucose, UA: NEGATIVE mg/dL
Ketones, ur: NEGATIVE mg/dL
Leukocytes,Ua: NEGATIVE
Nitrite: NEGATIVE
Protein, ur: NEGATIVE mg/dL
Specific Gravity, Urine: 1.02 (ref 1.005–1.030)
pH: 6 (ref 5.0–8.0)

## 2023-11-25 LAB — URINALYSIS, MICROSCOPIC (REFLEX): RBC / HPF: >50 RBC/hpf (ref 0–5)

## 2023-11-25 NOTE — Discharge Instructions (Signed)
 Please read and follow all provided instructions.  Your diagnoses today include:  1. Right flank pain   2. Gross hematuria     Tests performed today include: Urine test that showed blood in your urine and no infection Vital signs. See below for your results today.   Medications prescribed:  Please use over-the-counter NSAID medications (ibuprofen, naproxen) or Tylenol  (acetaminophen ) as directed on the packaging for pain -- as long as you do not have any reasons avoid these medications. Reasons to avoid NSAID medications include: weak kidneys, a history of bleeding in your stomach or gut, or uncontrolled high blood pressure or previous heart attack. Reasons to avoid Tylenol  include: liver problems or ongoing alcohol use. Never take more than 4000mg  or 8 Extra strength Tylenol  in a 24 hour period.     Home care instructions:  Follow any educational materials contained in this packet.  Please double your fluid intake for the next several days. Strain your urine and save any stones that may pass.   BE VERY CAREFUL not to take multiple medicines containing Tylenol  (also called acetaminophen ). Doing so can lead to an overdose which can damage your liver and cause liver failure and possibly death.   Follow-up instructions: If you continue to note blood in your urine, please follow-up with your primary care.  Return instructions:  Please return to the Emergency Department if you experience worsening symptoms.  Please return if you develop fever or uncontrolled pain or vomiting. Please return if you have any other emergent concerns.  Additional Information:  Your vital signs today were: BP (!) 139/93 (BP Location: Left Arm)   Pulse 76   Temp 98.7 F (37.1 C) (Oral)   Resp 16   Ht 5' 7 (1.702 m)   Wt 73.9 kg   SpO2 99%   BMI 25.53 kg/m  If your blood pressure (BP) was elevated above 135/85 this visit, please have this repeated by your doctor within one month. --------------

## 2023-11-25 NOTE — ED Triage Notes (Signed)
 C/o RLQ pain radiating to right flank x 2 mins. Pink tinged urine this morning, thought it was from the redbulls he drank last night. States drank water on the way here and pain is now gone.

## 2023-11-25 NOTE — ED Provider Notes (Signed)
 [place] [facility] [facility] Provider Note   CSN: [id number] Arrival date & time: 11/25/23  1645     Patient presents with: No chief complaint on file.   Tommy Henderson is a 33 y.o. male.   Patient presents to the emergency department for evaluation of acute onset of right flank pain with nausea, started acutely prior to arrival, and then spontaneously resolved.  Patient states that he was drinking alcohol last night and was drinking some energy drinks.  Patient noted some pink-tinged urine today and passage of what looked like a blood clot.  He thought this was related to the energy drinks.  No pain initially.  Pain started just prior to arrival, during transport to the ED, he drank some water and pain resolved.  No chest pain or shortness of breath.  No fevers.  No dysuria or diarrhea.  No history of kidney stones.  Patient denies dysuria, increased frequency or urgency, penile drainage or discharge.  He is not concerned at this time for STI.       Prior to Admission medications   Medication Sig Start Date End Date Taking? Authorizing Provider  hydrochlorothiazide  (HYDRODIURIL ) 25 MG tablet Take 1 tablet (25 mg total) by mouth daily. 07/04/23   Bauer, Collin S, PA-C  polyethylene glycol powder (SM CLEARLAX) 17 GM/SCOOP powder Mix  17 grams in 8 ounces of liquid and drink by mouth daily. 08/19/21   Elnor Bernarda SQUIBB, DO    Allergies: Patient has no known allergies.    Review of Systems  Updated Vital Signs BP (!) 139/93 (BP Location: Left Arm)   Pulse 76   Temp 98.7 F (37.1 C) (Oral)   Resp 16   Ht 5' 7 (1.702 m)   Wt 73.9 kg   SpO2 99%   BMI 25.53 kg/m   Physical Exam Vitals and nursing note reviewed.  Constitutional:      Appearance: He is well-developed.     Comments: Patient looks comfortable.  HENT:     Head: Normocephalic and atraumatic.  Eyes:     Conjunctiva/sclera: Conjunctivae normal.  Pulmonary:     Effort: No respiratory  distress.  Abdominal:     Tenderness: There is no abdominal tenderness. There is no guarding or rebound.  Musculoskeletal:     Cervical back: Normal range of motion and neck supple.  Skin:    General: Skin is warm and dry.  Neurological:     Mental Status: He is alert.     (all labs ordered are listed, but only abnormal results are displayed) Labs Reviewed  URINALYSIS, ROUTINE W REFLEX MICROSCOPIC - Abnormal; Notable for the following components:      Result Value   APPearance CLOUDY (*)    Hgb urine dipstick LARGE (*)    All other components within normal limits  URINALYSIS, MICROSCOPIC (REFLEX) - Abnormal; Notable for the following components:   Bacteria, UA MANY (*)    All other components within normal limits    EKG: None  Radiology: No results found.   Procedures   Medications Ordered in the ED - No data to display  ED Course  Patient seen and examined. History obtained directly from patient.   Labs/EKG: Ordered UA  Imaging: None ordered  Medications/Fluids: None ordered  Most recent vital signs reviewed and are as follows: BP (!) 139/93 (BP Location: Left Arm)   Pulse 76   Temp 98.7 F (37.1 C) (Oral)   Resp 16   Ht  5' 7 (1.702 m)   Wt 73.9 kg   SpO2 99%   BMI 25.53 kg/m   Initial impression: Flank pain, now resolved.  Urine changes morning, question hematuria, which could be consistent with ureteral colic.  Patient looks well at the current time.  Do not feel that he requires blood work or imaging unless symptoms return.  5:51 PM Reassessment performed. Patient appears stable.  No recurrent symptoms.  Labs personally reviewed and interpreted including: UA with blood, no significant pyuria.  Reviewed pertinent lab work and imaging with patient at bedside. Questions answered.   Most current vital signs reviewed and are as follows: BP (!) 139/93 (BP Location: Left Arm)   Pulse 76   Temp 98.7 F (37.1 C) (Oral)   Resp 16   Ht 5' 7 (1.702 m)    Wt 73.9 kg   SpO2 99%   BMI 25.53 kg/m   Plan: Discharge to home.  Symptoms are currently resolved.  Symptoms were short-lived.  With hematuria noted on UA without other signs of infection, and flank pain --possible ureteral stone.  This may have passed given resolution of symptoms.  Patient provided with strainer.  Encouraged use of NSAIDs for any discomfort at home.  Prescriptions written for: None  Other home care instructions discussed: NSAIDs, hydration  ED return instructions discussed: Return with uncontrolled symptoms including severe flank pain, vomiting  Follow-up instructions discussed: Patient encouraged to follow-up with their PCP in 5 days for recheck of urine and symptoms.                                  Medical Decision Making Amount and/or Complexity of Data Reviewed Labs: ordered.   Patient with some pink urine this morning concerning for hematuria.  Microscopic hematuria on UA today.  Patient without signs or symptoms of UTI or STI.  His flank pain that he had earlier is now resolved.  It is possible that he had a ureteral stone.  Given that symptoms are currently controlled, do not feel that he needs blood work or imaging.  Return instructions discussed as above.  Otherwise conservative care including hydration and NSAIDs.  On exam, abdomen is soft and nontender.  For this patient's complaint of abdominal pain, the following conditions were considered on the differential diagnosis: gastritis/PUD, enteritis/duodenitis, appendicitis, cholelithiasis/cholecystitis, cholangitis, pancreatitis, ruptured viscus, colitis, diverticulitis, small/large bowel obstruction, proctitis, cystitis, pyelonephritis, ureteral colic, aortic dissection, aortic aneurysm. Atypical chest etiologies were also considered including ACS, PE, and pneumonia.  The patient's vital signs, pertinent lab work and imaging were reviewed and interpreted as discussed in the ED course. Hospitalization was  considered for further testing, treatments, or serial exams/observation. However as patient is well-appearing, has a stable exam, and reassuring studies today, I do not feel that they warrant admission at this time. This plan was discussed with the patient who verbalizes agreement and comfort with this plan and seems reliable and able to return to the Emergency Department with worsening or changing symptoms.       Final diagnoses:  Right flank pain  Gross hematuria    ED Discharge Orders     None          Desiderio Chew, PA-C 11/25/23 1754    Elnor Jayson LABOR, DO 11/28/23 0732

## 2023-11-25 NOTE — ED Notes (Signed)
 Discharge instructions reviewed with patient. Patient verbalizes understanding, no further questions at this time. Medications and follow up information provided. No acute distress noted at time of departure.
